# Patient Record
Sex: Male | Born: 1945 | Race: Black or African American | Hispanic: No | Smoking: Never smoker
Health system: Southern US, Community
[De-identification: ages and names within clinical notes are randomized; demographics above are authoritative.]

## PROBLEM LIST (undated history)

## (undated) DIAGNOSIS — G40209 Localization-related (focal) (partial) symptomatic epilepsy and epileptic syndromes with complex partial seizures, not intractable, without status epilepticus: Secondary | ICD-10-CM

## (undated) DIAGNOSIS — I10 Essential (primary) hypertension: Secondary | ICD-10-CM

## (undated) DIAGNOSIS — I639 Cerebral infarction, unspecified: Secondary | ICD-10-CM

## (undated) HISTORY — DX: Localization-related (focal) (partial) symptomatic epilepsy and epileptic syndromes with complex partial seizures, not intractable, without status epilepticus: G40.209

## (undated) HISTORY — DX: Essential (primary) hypertension: I10

## (undated) HISTORY — PX: APPENDECTOMY: SHX54

---

## 2000-08-05 ENCOUNTER — Encounter: Payer: Self-pay | Admitting: Emergency Medicine

## 2000-08-05 ENCOUNTER — Inpatient Hospital Stay (HOSPITAL_COMMUNITY): Admission: EM | Admit: 2000-08-05 | Discharge: 2000-08-07 | Payer: Self-pay | Admitting: Emergency Medicine

## 2000-08-06 ENCOUNTER — Encounter: Payer: Self-pay | Admitting: Pediatrics

## 2001-06-20 ENCOUNTER — Ambulatory Visit (HOSPITAL_COMMUNITY): Admission: RE | Admit: 2001-06-20 | Discharge: 2001-06-20 | Payer: Self-pay | Admitting: *Deleted

## 2001-06-20 ENCOUNTER — Encounter (INDEPENDENT_AMBULATORY_CARE_PROVIDER_SITE_OTHER): Payer: Self-pay | Admitting: Specialist

## 2004-10-08 ENCOUNTER — Encounter (INDEPENDENT_AMBULATORY_CARE_PROVIDER_SITE_OTHER): Payer: Self-pay | Admitting: Specialist

## 2004-10-08 ENCOUNTER — Ambulatory Visit (HOSPITAL_COMMUNITY): Admission: RE | Admit: 2004-10-08 | Discharge: 2004-10-08 | Payer: Self-pay | Admitting: *Deleted

## 2007-08-12 ENCOUNTER — Emergency Department (HOSPITAL_COMMUNITY): Admission: EM | Admit: 2007-08-12 | Discharge: 2007-08-12 | Payer: Self-pay | Admitting: Emergency Medicine

## 2010-10-15 NOTE — Op Note (Signed)
NAME:  [name], [name]                 ACCOUNT NO.:  [id number]   MEDICAL RECORD NO.:  [id number]          PATIENT TYPE:  AMB   LOCATION:  ENDO                         FACILITY:  [facility]   PHYSICIAN:  Georgiana Spinner, M.D.    DATE OF BIRTH:  1945/07/17   DATE OF PROCEDURE:  10/08/2004  DATE OF DISCHARGE:                                 OPERATIVE REPORT   PROCEDURE:  Colonoscopy with biopsy.   INDICATIONS:  Colon polyp.   ANESTHESIA:  Demerol 70, Versed 7 mg.   PROCEDURE:  With the patient mildly sedated in the left lateral decubitus  position, a rectal examination was performed.  Prostate felt enlarged, but I  could not feel the whole thing.  What I did feel was smooth, symmetrical.  Subsequently, the Olympus videoscopic colonoscope was then inserted in the  rectum, passed under direct vision to the cecum identified by ileocecal  valve and base of the cecum.  In the space of the cecum was a polyp that was  photographed and removed using hot biopsy forceps technique setting of 20/20  blended current.  From this point, the colonoscope was then slowly  withdrawn, taking circumferential views of the colonic mucosa, suctioning  fecal debris as we went until we reached the rectum which appeared normal on  direct and retroflexed view.  The endoscope was then straightened and  withdrawn through the anal canal which showed external hemorrhoids.  The  endoscope was withdrawn.  The patient's vital signs, pulse oximeter remained  stable.  The patient tolerated procedure well without apparent  complications.   FINDINGS:  Polyp of cecum.  Await biopsy report.  The patient will call me  for results and follow-up with me as an outpatient      GMO/MEDQ  D:  10/08/2004  T:  10/08/2004  Job:  [id number]

## 2010-10-15 NOTE — Procedures (Signed)
[facility]  Patient:    JEF, FUTCH ROMEL Visit Number: [id number] MRN: [id number]          Service Type: END Location: ENDO Attending Physician:  Sabino Gasser Dictated by:   Sabino Gasser, M.D. Proc. Date: 06/20/01 Admit Date:  06/20/2001                             Procedure Report  PROCEDURE:  Colonoscopy.  INDICATION FOR PROCEDURE:  Colon polyps.  ANESTHESIA:  Demerol 90, Versed 8 mg.  DESCRIPTION OF PROCEDURE:  With the patient mildly sedated in the left lateral decubitus position, the Olympus videoscopic colonoscope was inserted in the rectum and passed under direct vision to the cecum after a normal rectal exam. The cecum was identified by the ileocecal valve and appendiceal orifice both of which were photographed. From this point, the colonoscope was slowly withdrawn taking circumferential views of the entire colonic mucosa, stopping in the rectosigmoid where two polyps were seen, photographed and each was removed using snare cautery technique on a setting of 20:20 blended current. However, the second bled somewhat with removal and the polyp base was then grasped with a hot biopsy forceps and cauterized again using a setting of 20:20 blended current. No active bleeding was seen. The endoscope was withdrawn to the rectum which appeared normal in direct and retroflexed view. The endoscope was straightened and withdrawn. The patients vital signs and pulse oximeter remained stable. The patient tolerated the procedure well without apparent complications.  FINDINGS:  Polyps of rectosigmoid. Await biopsy report. There was some diverticula seen in the sigmoid colon as well. The patient will call me for results of biopsy and followup with me as an outpatient. Avoid aspirin therapy for two weeks due to some of the bleeding seen post polypectomy. Dictated by:   Sabino Gasser, M.D. Attending Physician:  Sabino Gasser DD:  06/20/01 TD:  06/21/01 Job:  [id number]

## 2010-10-15 NOTE — H&P (Signed)
[facility]  Patient:    Ronald [name], Ronald [name]                          MRN: [id number] Adm. Date:  [id number] Disc. Date: [id number] Attending:  Mick Sell CC:         Lorelle Formosa, M.D.   History and Physical  DATE OF BIRTH:  1945-09-30.  CHIEF COMPLAINT:  Problems with memory -- acute.  HISTORY OF THE PRESENT CONDITION:  Patient had onset of memory dysfunction around 2 p.m.  His girlfriend asked him a simple question and he seemed to be unable to answer it.  She became at first suspicious that he was clowning around and then asked him a number of specific questions that demonstrated to her clearly that he did not have memory for recent events or for things that he should know.  His symptoms continued and he was brought to the emergency room around 1755 hours.  I was called at 1840 and agreed to see him when Dr. Smitty Cords. Cheek found that he was having significant problems with his memory, both acute and remote, but otherwise had a nonfocal examination.  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  He has not had intercurrent infections in the head and neck, lungs, GI or GU; rash; easy bruisability; diabetes or thyroid disease; nausea; vomiting; diarrhea; dyspnea; chest pain; palpitations; fever; change in weight; problems with sleep or night sweats.  Review of systems is otherwise negative.  MEDICATIONS:  None.  ALLERGIES:  None.  PAST SURGICAL HISTORY:  Appendectomy.  SOCIAL HISTORY:  Patient does not smoke or use alcohol.  He has been with his girlfriend for two years now (she is at bedside).  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  The pain has gradually improved.  Workup with Dr. Lorelle Formosa was negative for blood work but he has not had any x-rays.  He has a 12th grade education.  FAMILY HISTORY:   Father died of diabetes.  Mother died of cancer.  Sister died of cancer.  No history of hypertension, cerebrovascular disease or myocardial infarction.  PHYSICAL EXAMINATION:  GENERAL:  On examination today, this is a pleasant gentleman, 65 years of age, right-handed, in no distress.  VITAL SIGNS:  Blood pressure 149/86, resting pulse 94, respirations 20, temperature 98.1.  HEENT/NECK:  No signs of infection.  No cranial or cervical bruits.  No localized tenderness in the head and neck region.  LUNGS:  Clear to auscultation.  HEART:  No murmurs.  Pulses normal.  ABDOMEN:  Soft, nontender.  Bowel sounds normal.  EXTREMITIES:  Well-formed without edema, cyanosis, alterations in tone or tight heel cords.  He does have tenderness in his knees; they do not seem to be enlarged or inflamed.  NEUROLOGIC:  Mental status:  Patient was awake, alert.  He knows his location (at [facility]), month, day and year but not the date.  He could recall 3-of-3 objects at 30 seconds but only 1 of 3 at 5 minutes. Digit span was six digits forward and three backwards.  He was able to name objects, follow commands and repeat phrases.  He could not do simple calculations or even spell the word "world" forwards; this is despite a 12th grade education.  Cranial nerves:  Round,  reactive pupils.  Normal fundi.  Visual fields full to double simultaneous stimuli.  Extraocular movements full and conjugate, okay and responses equal bilaterally.  Symmetric facial strength and sensation. Air conduction greater than bone conduction.  He was able to protrude his tongue and elevate his uvula in the midline.  Motor examination:  Normal stance, tone and mass.  Good fine motor movements. No pronator drift.  Sensation intact to cold, vibration and stereoagnosis.  He does sense a mild hypesthesia on his left face, that is, the right face feels cooler both in the head and neck region.  His sensation is equal  on the torso.  Cerebellum:  No tremor, dystaxia or dysmetria.  Gait and station were mildly broad-based.  He can walk on his heels and toes.  He cannot tandem.  He has a negative Romberg response.  Deep tendon reflexes are normal except at the ankles where they were absent and patient has bilateral flexor plantar responses.  IMPRESSION: 1. 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  PLAN:  I have reviewed the CT scan personally.  It is normal other than some mild changes in the ethmoid air cells, consistent with congested and/or mild sinusitis.  Patients laboratory studies:  Urinalysis -- pH 7, specific gravity 1.026. Chemistries were negative.  Urine toxicology screen was negative. Comprehensive metabolic:  Sodium 139, potassium 4.0, chloride 105, CO2 26, BUN 15, creatinine 0.9, glucose 119, calcium 9.6, total protein 7.5, albumin 4.0, SGOT 30, SGPT 44, alkaline phosphatase 72, total bilirubin 0.7.  PT 13, PTT 30.  CBC:  WBC count 8500, hemoglobin 15.2, hematocrit 43.9, MCV 85.6, platelet count 264,000, 76 polys, 15 lymphs, 7 monocytes, 1 eosinophil.  EKG:  Sinus tachycardia.  Patients blood pressure which was described initially has varied, with the systolic dropping down to 135, diastolic sitting around 85, resting pulse 90, pulse oximetry 99%.  We will admit the patient to the hospital, observe him, will check an MRI/MRA, perform an EEG, which could be done as an outpatient.  Patient is most unhappy at being admitted to the hospital but understands that this is necessary because of the differential diagnosis which could include stroke but in this case is unlikely to be related to stroke.  While in the hospital, we will perform simple x-ray films of his knees and I may take this further, depending on what we see. DD:  08/05/00 TD:  08/07/00 Job: [id number]

## 2010-10-15 NOTE — Discharge Summary (Signed)
[facility]  Patient:    Ronald [name], Ronald [name]                          MRN: [id number] Adm. Date:  [id number] Disc. Date: 08/07/00 Attending:  Mick Sell CC:         Lorelle Formosa, M.D.   Discharge Summary  FINAL DIAGNOSES: 1. 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  PROCEDURES PERFORMED: 1. MRI of brain. 2. MRA intracranial. 3. EEG.  COMPLICATIONS:  None.  HOSPITAL COURSE:  The patient was admitted because of a sudden onset of problems with acute and remote memory. This began to clear while he was in the emergency room, however, it had not fully cleared by the next morning of his hospitalization. 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 There was mild maxillary sinus disease.  MR angiography was normal other than the anterior inferior cerebellar arteries were not visualized bilaterally. Electroencephalogram has not been performed at the time of this dictation and will be done today.  His laboratory studies are as follows:  The patient has had a regular sinus rhythm in the hospitalization. His EKG showed sinus tachycardia which had settled down to regular rate and rhythm. X-rays of his knees showed unremarkable right and left knees without evidence of fracture, subluxation, dislocation, joint effusion or significant joint degenerative changes.  Laboratory studies have been noted in the history and physical examination. CBC was normal. Glucose was slightly elevated at 119, but this was a nonfasting study; ALT slightly elevated at 44. Urine drug screen was negative. Urinalysis also was negative.  This morning, the patient is awake, he has just awakened and was somewhat sleepy. None the less, he was fully oriented. Vital signs revealed a blood pressure of 126/70,  resting pulse 72, respirations 16 and temperature 97.2. Lungs clear. Heart -- no murmurs, pulse is normal. Abdomen is soft, bowel sounds are normal. Extremities were normal. On neurologic examination, the patient is thinking slowly but fully awake. He has no dysphagia or dyspraxia. He is oriented to person, place and date. His memory is better than it was on the night of admission. Cranial nerves revealed round reactive pupils, normal fundi, visual fields full to double simultaneous stimuli. Extraocular movements full and conjugate. Okay in response to confrontation bilaterally. Symmetrical facial strength and sensation. Air conduction greater than bone conduction. He has a soft high-pitched voice that is unchanged from his admission. Motor examination reveals normal strength, tone and mass. Good fine motor movements, no pronator drift. Sensation intact to primary and cortical modalities. Cerebellar examination reveals no tremor, dystaxia or dysmetria. Gait and station was normal. He is not dragging his foot. He can get up on his toes and heels.  Deep tendon reflexes were normal except at the ankles which were diminished.  DISPOSITION: The patient is safe to discharge home. We will perform an EEG before he goes home, either at [facility] or [facility].  DISCHARGE INSTRUCTIONS: 1. He should start an aspirin 325 mg per day. 2. He should eat a low-sat, low-fat diet. 3. He can return to work when his leave is up (next week). 4. He should be followed up this week in our office so that we can tie together loose ends from the hospitalization and plan for any further evaluation. DD:  08/07/00 TD:  08/07/00 Job: [id number]

## 2011-02-21 LAB — URINE MICROSCOPIC-ADD ON

## 2011-02-21 LAB — COMPREHENSIVE METABOLIC PANEL
ALT: 22
AST: 21
Albumin: 3.6
Alkaline Phosphatase: 73
Chloride: 104
Potassium: 3.7
Sodium: 136
Total Protein: 6.8

## 2011-02-21 LAB — URINALYSIS, ROUTINE W REFLEX MICROSCOPIC
Hgb urine dipstick: NEGATIVE
Nitrite: NEGATIVE
Specific Gravity, Urine: 1.023
Urobilinogen, UA: 1
pH: 8

## 2011-02-21 LAB — DIFFERENTIAL
Basophils Relative: 0
Eosinophils Absolute: 0
Eosinophils Relative: 0
Monocytes Absolute: 0.2
Monocytes Relative: 2 — ABNORMAL LOW

## 2011-02-21 LAB — CBC
Hemoglobin: 14.5
Platelets: 244
RDW: 13.7
WBC: 9.7

## 2013-09-28 ENCOUNTER — Emergency Department (HOSPITAL_COMMUNITY): Payer: Medicare Other

## 2013-09-28 ENCOUNTER — Inpatient Hospital Stay (HOSPITAL_COMMUNITY)
Admission: EM | Admit: 2013-09-28 | Discharge: 2013-09-30 | DRG: 101 | Disposition: A | Discharge status: Home / Self Care | Payer: Medicare Other | Attending: Internal Medicine | Admitting: Internal Medicine

## 2013-09-28 ENCOUNTER — Encounter (HOSPITAL_COMMUNITY): Payer: Self-pay | Admitting: Emergency Medicine

## 2013-09-28 DIAGNOSIS — R569 Unspecified convulsions: Secondary | ICD-10-CM

## 2013-09-28 DIAGNOSIS — E876 Hypokalemia: Secondary | ICD-10-CM

## 2013-09-28 DIAGNOSIS — R030 Elevated blood-pressure reading, without diagnosis of hypertension: Secondary | ICD-10-CM

## 2013-09-28 DIAGNOSIS — R81 Glycosuria: Secondary | ICD-10-CM | POA: Diagnosis present

## 2013-09-28 DIAGNOSIS — G40401 Other generalized epilepsy and epileptic syndromes, not intractable, with status epilepticus: Secondary | ICD-10-CM

## 2013-09-28 DIAGNOSIS — G40109 Localization-related (focal) (partial) symptomatic epilepsy and epileptic syndromes with simple partial seizures, not intractable, without status epilepticus: Principal | ICD-10-CM | POA: Diagnosis present

## 2013-09-28 DIAGNOSIS — G40201 Localization-related (focal) (partial) symptomatic epilepsy and epileptic syndromes with complex partial seizures, not intractable, with status epilepticus: Secondary | ICD-10-CM | POA: Diagnosis present

## 2013-09-28 DIAGNOSIS — IMO0001 Reserved for inherently not codable concepts without codable children: Secondary | ICD-10-CM | POA: Diagnosis present

## 2013-09-28 DIAGNOSIS — I10 Essential (primary) hypertension: Secondary | ICD-10-CM | POA: Diagnosis present

## 2013-09-28 DIAGNOSIS — Z8673 Personal history of transient ischemic attack (TIA), and cerebral infarction without residual deficits: Secondary | ICD-10-CM

## 2013-09-28 HISTORY — DX: Cerebral infarction, unspecified: I63.9

## 2013-09-28 LAB — CBC WITH DIFFERENTIAL/PLATELET
BASOS ABS: 0 10*3/uL (ref 0.0–0.1)
BASOS PCT: 0 % (ref 0–1)
Eosinophils Absolute: 0 10*3/uL (ref 0.0–0.7)
Eosinophils Relative: 0 % (ref 0–5)
HEMATOCRIT: 39.8 % (ref 39.0–52.0)
HEMOGLOBIN: 13.7 g/dL (ref 13.0–17.0)
LYMPHS PCT: 13 % (ref 12–46)
Lymphs Abs: 1.1 10*3/uL (ref 0.7–4.0)
MCH: 29.3 pg (ref 26.0–34.0)
MCHC: 34.4 g/dL (ref 30.0–36.0)
MCV: 85 fL (ref 78.0–100.0)
MONO ABS: 0.4 10*3/uL (ref 0.1–1.0)
MONOS PCT: 5 % (ref 3–12)
NEUTROS ABS: 6.9 10*3/uL (ref 1.7–7.7)
NEUTROS PCT: 82 % — AB (ref 43–77)
Platelets: 193 10*3/uL (ref 150–400)
RBC: 4.68 MIL/uL (ref 4.22–5.81)
RDW: 13.2 % (ref 11.5–15.5)
WBC: 8.5 10*3/uL (ref 4.0–10.5)

## 2013-09-28 LAB — URINALYSIS, ROUTINE W REFLEX MICROSCOPIC
Bilirubin Urine: NEGATIVE
GLUCOSE, UA: 100 mg/dL — AB
HGB URINE DIPSTICK: NEGATIVE
KETONES UR: NEGATIVE mg/dL
Leukocytes, UA: NEGATIVE
Nitrite: NEGATIVE
PROTEIN: NEGATIVE mg/dL
Specific Gravity, Urine: 1.02 (ref 1.005–1.030)
UROBILINOGEN UA: 2 mg/dL — AB (ref 0.0–1.0)
pH: 7 (ref 5.0–8.0)

## 2013-09-28 LAB — COMPREHENSIVE METABOLIC PANEL
ALBUMIN: 3.4 g/dL — AB (ref 3.5–5.2)
ALK PHOS: 88 U/L (ref 39–117)
ALT: 13 U/L (ref 0–53)
AST: 20 U/L (ref 0–37)
BILIRUBIN TOTAL: 0.7 mg/dL (ref 0.3–1.2)
BUN: 17 mg/dL (ref 6–23)
CHLORIDE: 105 meq/L (ref 96–112)
CO2: 25 meq/L (ref 19–32)
CREATININE: 0.85 mg/dL (ref 0.50–1.35)
Calcium: 8.8 mg/dL (ref 8.4–10.5)
GFR calc Af Amer: >90 mL/min (ref >90)
GFR, EST NON AFRICAN AMERICAN: 88 mL/min — AB (ref >90)
Glucose, Bld: 160 mg/dL — ABNORMAL HIGH (ref 70–99)
POTASSIUM: 3.4 meq/L — AB (ref 3.7–5.3)
Sodium: 141 mEq/L (ref 137–147)
Total Protein: 7.1 g/dL (ref 6.0–8.3)

## 2013-09-28 LAB — CBG MONITORING, ED: GLUCOSE-CAPILLARY: 181 mg/dL — AB (ref 70–99)

## 2013-09-28 LAB — RAPID URINE DRUG SCREEN, HOSP PERFORMED
AMPHETAMINES: NOT DETECTED
BARBITURATES: NOT DETECTED
BENZODIAZEPINES: NOT DETECTED
Cocaine: NOT DETECTED
Opiates: NOT DETECTED
TETRAHYDROCANNABINOL: NOT DETECTED

## 2013-09-28 LAB — ETHANOL: Alcohol, Ethyl (B): <11 mg/dL (ref 0–11)

## 2013-09-28 MED ORDER — HEPARIN SODIUM (PORCINE) 5000 UNIT/ML IJ SOLN
5000.0000 [IU] | Freq: Three times a day (TID) | INTRAMUSCULAR | Status: DC
  Administered 2013-09-29 – 2013-09-30 (×4): 5000 [IU] via SUBCUTANEOUS
  Filled 2013-09-28 (×7): qty 1

## 2013-09-28 MED ORDER — LORAZEPAM 2 MG/ML IJ SOLN
0.5000 mg | Freq: Once | INTRAMUSCULAR | Status: AC
  Administered 2013-09-28: 0.5 mg via INTRAVENOUS
  Filled 2013-09-28: qty 1

## 2013-09-28 MED ORDER — SODIUM CHLORIDE 0.9 % IV BOLUS (SEPSIS)
1000.0000 mL | Freq: Once | INTRAVENOUS | Status: AC
  Administered 2013-09-28: 1000 mL via INTRAVENOUS

## 2013-09-28 MED ORDER — LEVETIRACETAM 500 MG/5ML IV SOLN
1000.0000 mg | Freq: Once | INTRAVENOUS | Status: AC
  Administered 2013-09-28: 1000 mg via INTRAVENOUS
  Filled 2013-09-28: qty 10

## 2013-09-28 MED ORDER — LEVETIRACETAM 500 MG PO TABS
500.0000 mg | ORAL_TABLET | Freq: Two times a day (BID) | ORAL | Status: DC
  Administered 2013-09-29 – 2013-09-30 (×3): 500 mg via ORAL
  Filled 2013-09-28 (×4): qty 1

## 2013-09-28 MED ORDER — SODIUM CHLORIDE 0.9 % IJ SOLN
3.0000 mL | Freq: Two times a day (BID) | INTRAMUSCULAR | Status: DC
  Administered 2013-09-29 (×2): 3 mL via INTRAVENOUS

## 2013-09-28 NOTE — ED Notes (Signed)
Patient transported to CT 

## 2013-09-28 NOTE — Progress Notes (Signed)
Attempted report [id number] with two RNs, gave number to call back.

## 2013-09-28 NOTE — ED Notes (Signed)
CBGTaken = 188

## 2013-09-28 NOTE — ED Notes (Signed)
Pt returned from radiology.

## 2013-09-28 NOTE — Consult Note (Signed)
Neurology Consultation Reason for Consult: Episodes of altered mental status Referring Physician: Maryan Rued, W.  CC: Episodes of altered mental status  History is obtained from: Patient, friend  HPI: Ronald [name] is a 68 y.o. male with a history of stroke who presents with recurrent episodes of altered mental status over the course of an hour. His friend describes that he felt nauseous, then become unresponsive for brief periods of time. He had right head turning with right eye deviation as well as right arm automatisms. He would then suddenly come back to being able to interact. These are all fairly brief, but he had recurrent episodes repeatedly over the course of an hour without ever come back completely to his normal self.   ROS: A 14 point ROS was performed and is negative except as noted in the HPI.  Past Medical History  Diagnosis Date  . Stroke     Family History: No history of seizures  Social History: Tob: Denies  Exam: Current vital signs: BP 145/91  Pulse 104  Temp(Src) 98.5 F (36.9 C) (Oral)  Resp 21  SpO2 98% Vital signs in last 24 hours: Temp:  [98.5 F (36.9 C)] 98.5 F (36.9 C) (05/02 2035) Pulse Rate:  [102-104] 104 (05/02 2139) Resp:  [20-21] 21 (05/02 2139) BP: (145-184)/(81-94) 145/91 mmHg (05/02 2139) SpO2:  [98 %] 98 % (05/02 2139)  General: In bed, NAD CV: Regular rate and rhythm Mental Status: Patient is awake, alert, oriented to person, place, month, year, and situation. Immediate and remote memory are intact. Patient is able to give a clear and coherent history. No signs of aphasia or neglect Cranial Nerves: II: Visual Fields are full. Pupils are equal, round, and reactive to light.  Discs are difficult to visualize. III,IV, VI: EOMI without ptosis or diploplia.  V: Facial sensation is symmetric to temperature VII: Facial movement is symmetric.  VIII: hearing is intact to voice X: Uvula elevates symmetrically XI: Shoulder shrug is  symmetric. XII: tongue is midline without atrophy or fasciculations.  Motor: Tone is normal. Bulk is normal. 5/5 strength was present in all four extremities.  Sensory: Sensation is symmetric to light touch and temperature in the arms and legs. Deep Tendon Reflexes: 2+ and symmetric in the biceps and patellae.  Plantars: Toes are downgoing bilaterally.  Cerebellar: FNF and HKS are intact bilaterally Gait: Not tested secondary multiple medical monitors in ED setting.         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 He seems to improve markedly following 0.5 mg of Ativan. Given the recurrent nature and the number of seizures, I would favor admission for observation.  Recommendations: 1) MRI brain without contrast 2) EEG 3) Keppra 500 mg twice a day following 1 g load   Roland Rack, MD [facility] [phone]  If 7pm- 7am, please [facility] neurology on call as listed in [facility].

## 2013-09-28 NOTE — ED Provider Notes (Addendum)
CSN: [id number]     Arrival date & time 09/28/13  1937 History   First MD Initiated Contact with Patient 09/28/13 1937     Chief Complaint  Patient presents with  . Altered Mental Status     (Consider location/radiation/quality/duration/timing/severity/associated sxs/prior Treatment) Patient is a 68 y.o. male presenting with altered mental status. The history is provided by the patient and the EMS personnel.  Altered Mental Status Presenting symptoms: partial responsiveness   Severity:  Moderate Most recent episode:  Today Episode history:  Multiple Duration:  10 seconds Timing:  Intermittent Progression:  Unchanged Chronicity:  New Context: not alcohol use, not drug use, not head injury, taking medications as prescribed, not a nursing home resident, not a recent change in medication, not a recent illness and not a recent infection   Associated symptoms: no difficulty breathing, no palpitations, no slurred speech, no vomiting and no weakness   Associated symptoms comment:  Mild RLQ pain and states he feels a little dizzy   Past Medical History  Diagnosis Date  . Stroke    Past Surgical History  Procedure Laterality Date  . Appendectomy     History reviewed. No pertinent family history. History  Substance Use Topics  . Smoking status: Never Smoker   . Smokeless tobacco: Not on file  . Alcohol Use: No    Review of Systems  Cardiovascular: Negative for palpitations.  Gastrointestinal: Negative for vomiting.  Neurological: Negative for weakness.  All other systems reviewed and are negative.     Allergies  Review of patient's allergies indicates no known allergies.  Home Medications   Prior to Admission medications   Not on File   BP 184/94  Pulse 102  Temp(Src) 98.5 F (36.9 C) (Oral)  Resp 21  SpO2 98% Physical Exam  Nursing note and vitals reviewed. Constitutional: He is oriented to person, place, and time. He appears well-developed and well-nourished.  No distress.  HENT:  Head: Normocephalic and atraumatic.  Mouth/Throat: Oropharynx is clear and moist.  Eyes: Conjunctivae and EOM are normal. Pupils are equal, round, and reactive to light.  Neck: Normal range of motion. Neck supple.  Cardiovascular: Regular rhythm and intact distal pulses.  Tachycardia present.   No murmur heard. Pulmonary/Chest: Effort normal and breath sounds normal. No respiratory distress. He has no wheezes. He has no rales.  Abdominal: Soft. He exhibits no distension. There is no tenderness. There is no rebound and no guarding.  Musculoskeletal: Normal range of motion. He exhibits no edema and no tenderness.  Neurological: He is alert and oriented to person, place, and time.  When pt has an episode his head falls back on the bed, eyes are fixed and he does not respond then jerks and seems slightly confused where he is and then back to baseline  Skin: Skin is warm and dry. No rash noted. No erythema.  Psychiatric: He has a normal mood and affect. His behavior is normal.    ED Course  Procedures (including critical care time) Labs Review Labs Reviewed  CBC WITH DIFFERENTIAL - Abnormal; Notable for the following:    Neutrophils Relative % 82 (*)    All other components within normal limits  COMPREHENSIVE METABOLIC PANEL - Abnormal; Notable for the following:    Potassium 3.4 (*)    Glucose, Bld 160 (*)    Albumin 3.4 (*)    GFR calc non Af Amer 88 (*)    All other components within normal limits  URINALYSIS, ROUTINE W REFLEX  MICROSCOPIC - Abnormal; Notable for the following:    Glucose, UA 100 (*)    Urobilinogen, UA 2.0 (*)    All other components within normal limits  CBG MONITORING, ED - Abnormal; Notable for the following:    Glucose-Capillary 181 (*)    All other components within normal limits  ETHANOL  URINE RAPID DRUG SCREEN (HOSP PERFORMED)    Imaging Review Dg Chest 2 View  09/28/2013   CLINICAL DATA:  Altered mental status.  EXAM: CHEST  2  VIEW  COMPARISON:  Report dated 08/05/2000.  FINDINGS: Normal sized heart. Clear lungs with normal vascularity. Thoracolumbar spine degenerative changes.  IMPRESSION: No acute abnormality.   Electronically Signed   By: Enrique Sack M.D.   On: 09/28/2013 21:16   Ct Head Wo Contrast  09/28/2013   CLINICAL DATA:  Altered mental status  EXAM: CT HEAD WITHOUT CONTRAST  TECHNIQUE: Contiguous axial images were obtained from the base of the skull through the vertex without intravenous contrast.  COMPARISON:  None.  FINDINGS: No evidence of parenchymal hemorrhage or extra-axial fluid collection. No mass lesion, mass effect, or midline shift.  No CT evidence of acute infarction.  Subcortical white matter and periventricular small vessel ischemic changes.  Cerebral volume is within normal limits.  No ventriculomegaly.  Partial opacification of the bilateral maxillary sinusitis. Mastoid air cells are clear.  No evidence of calvarial fracture.  IMPRESSION: No evidence of acute intracranial abnormality.  Small vessel ischemic changes.   Electronically Signed   By: Julian Hy M.D.   On: 09/28/2013 21:02     Date: 09/28/2013  Rate: 105  Rhythm: sinus tachycardia  QRS Axis: normal  Intervals: normal  ST/T Wave abnormalities: nonspecific ST changes  Conduction Disutrbances:none  Narrative Interpretation:   Old EKG Reviewed: unchanged   MDM   Final diagnoses:  Seizure    Patient presenting by EMS with brief episodes of unresponsiveness that is now been ongoing for the last one hour. They occur every 2-3 minutes and lasts for less than 30 seconds. Patient has no prior history of similar in his only complaint is feeling dizzy. He denies any medications, drug or alcohol use. He does have a history of stroke in 2006 but denies taking any medication for this.  Patient denies any history of seizure in the past. On exam patient has normal mental status neurologic exam however every 2-3 minutes he has a 5-10  second episode where he becomes unresponsive his eyes are fixed and then he becomes startled seems slightly confused and then is back to normal mental function alert oriented x3.  Concern that this could be partial complex seizures versus other neurologic cause for her symptoms. Does not appear to be syncope or dysrhythmia as patient is on monitoring heart rate does not change. Patient is hypertensive here without history of the same. Low suspicion for drug or alcohol use as patient denies any does not appear intoxicated.  He denies any chest pain or shortness of breath.  CBC, CMP, UA, EtOH, UDS, CBG, chest x-ray and head CT pending for further evaluation. Patient given 0.5 mg of IV Ativan to see if symptoms resolve  9:53 PM All labs, head CT and chest x-ray without acute findings. After getting 0.5 of Ativan patient's symptoms completely resolved. Discussed patient with neurology and they will come and evaluate him for possible new onset seizures.  10:28 PM Patient seen by neurology and felt this was an episode of status epilepticus which is now  resolved and the last one hour. They recommend admission for MRI and EEG. Also probably need evaluation for new-onset diabetes and hypertension.  Blanchie Dessert, MD 09/28/13 2153  Blanchie Dessert, MD 09/28/13 [id number]  Blanchie Dessert, MD 09/28/13 2245

## 2013-09-28 NOTE — H&P (Addendum)
[facility] History and Physical  Ronald [name] XAJ:[id number] DOB: 1946/03/25 DOA: 09/28/2013  Referring physician: EDP PCP: No primary provider on file.   Chief Complaint: AMS   HPI: Ronald [name] is a 68 y.o. male who presents to the ED with AMS.  Per family he will have 10 seconds or less of unresponsiveness, followed by confusion, followed by return to normal mental status.  This started earlier today and has been persistent throughout the day occuring every 2-3 mins.  Denies a history of seizures in the past.  He had several of these episodes in the ED witnessed by the EDP, and EDP decided to give 0.5mg  of ativan to see if this had any effect.  Symptoms have completely resolved since then without returning.  Review of Systems: Systems reviewed.  As above, otherwise negative  Past Medical History  Diagnosis Date  . Stroke    Past Surgical History  Procedure Laterality Date  . Appendectomy     Social History:  reports that he has never smoked. He does not have any smokeless tobacco history on file. He reports that he does not drink alcohol or use illicit drugs.  No Known Allergies  History reviewed. No pertinent family history.   Prior to Admission medications   Medication Sig Start Date End Date Taking? Authorizing Provider  naproxen sodium (ANAPROX) 220 MG tablet Take 220 mg by mouth daily as needed (headache).   Yes Historical Provider, MD   Physical Exam: Filed Vitals:   09/28/13 2139  BP: 145/91  Pulse: 104  Temp:   Resp: 21    BP 145/91  Pulse 104  Temp(Src) 98.5 F (36.9 C) (Oral)  Resp 21  SpO2 98%  General Appearance:    Alert, oriented, no distress, appears stated age  Head:    Normocephalic, atraumatic  Eyes:    PERRL, EOMI, sclera non-icteric        Nose:   Nares without drainage or epistaxis. Mucosa, turbinates normal  Throat:   Moist mucous membranes. Oropharynx without erythema or exudate.  Neck:   Supple. No carotid bruits.  No  thyromegaly.  No lymphadenopathy.   Back:     No CVA tenderness, no spinal tenderness  Lungs:     Clear to auscultation bilaterally, without wheezes, rhonchi or rales  Chest wall:    No tenderness to palpitation  Heart:    Regular rate and rhythm without murmurs, gallops, rubs  Abdomen:     Soft, non-tender, nondistended, normal bowel sounds, no organomegaly  Genitalia:    deferred  Rectal:    deferred  Extremities:   No clubbing, cyanosis or edema.  Pulses:   2+ and symmetric all extremities  Skin:   Skin color, texture, turgor normal, no rashes or lesions  Lymph nodes:   Cervical, supraclavicular, and axillary nodes normal  Neurologic:   CNII-XII intact. Normal strength, sensation and reflexes      throughout    Labs on Admission:  Basic Metabolic Panel:  Recent Labs Lab 09/28/13 2036  NA 141  K 3.4*  CL 105  CO2 25  GLUCOSE 160*  BUN 17  CREATININE 0.85  CALCIUM 8.8   Liver Function Tests:  Recent Labs Lab 09/28/13 2036  AST 20  ALT 13  ALKPHOS 88  BILITOT 0.7  PROT 7.1  ALBUMIN 3.4*   No results found for this basename: LIPASE, AMYLASE,  in the last 168 hours No results found for this basename: AMMONIA,  in the last  168 hours CBC:  Recent Labs Lab 09/28/13 2036  WBC 8.5  NEUTROABS 6.9  HGB 13.7  HCT 39.8  MCV 85.0  PLT 193   Cardiac Enzymes: No results found for this basename: CKTOTAL, CKMB, CKMBINDEX, TROPONINI,  in the last 168 hours  BNP (last 3 results) No results found for this basename: PROBNP,  in the last 8760 hours CBG:  Recent Labs Lab 09/28/13 2012  GLUCAP 181*    Radiological Exams on Admission: Dg Chest 2 View  09/28/2013   CLINICAL DATA:  Altered mental status.  EXAM: CHEST  2 VIEW  COMPARISON:  Report dated 08/05/2000.  FINDINGS: Normal sized heart. Clear lungs with normal vascularity. Thoracolumbar spine degenerative changes.  IMPRESSION: No acute abnormality.   Electronically Signed   By: Enrique Sack M.D.   On: 09/28/2013  21:16   Ct Head Wo Contrast  09/28/2013   CLINICAL DATA:  Altered mental status  EXAM: CT HEAD WITHOUT CONTRAST  TECHNIQUE: Contiguous axial images were obtained from the base of the skull through the vertex without intravenous contrast.  COMPARISON:  None.  FINDINGS: No evidence of parenchymal hemorrhage or extra-axial fluid collection. No mass lesion, mass effect, or midline shift.  No CT evidence of acute infarction.  Subcortical white matter and periventricular small vessel ischemic changes.  Cerebral volume is within normal limits.  No ventriculomegaly.  Partial opacification of the bilateral maxillary sinusitis. Mastoid air cells are clear.  No evidence of calvarial fracture.  IMPRESSION: No evidence of acute intracranial abnormality.  Small vessel ischemic changes.   Electronically Signed   By: Julian Hy M.D.   On: 09/28/2013 21:02    EKG: Independently reviewed.  Assessment/Plan Active Problems:   Complex partial status epilepticus   Elevated blood pressure   Glucosuria   1. Complex partial status epilepticus - after neurology evaluated him, it is felt his symptoms today represent complex partial status epilepticus.  He has been loaded with keppra and BID keppra has been ordered by neurology.  EEG ordered, MRI ordered.  Admitting to inpatient on tele monitor. 2. Elevated BP - elevated BP in ED with no known history of HTN, but he does not see a doctor regularly, suspect he has undiagnosed HTN, will follow. 3. Glucosuria - again patient does not see doctor at baseline, CBG checks ordered and A1C ordered, BGL only 160 this evening however, so will hold off on treating for now.    Code Status: Full Code  Family Communication: No family in room Disposition Plan: Admit to inpatient   Time spent: 6 min  [facility] Pager [phone]  If 7AM-7PM, please contact the day team taking care of the patient [email or web address] Password [facility] 09/28/2013, 10:49  PM

## 2013-09-28 NOTE — ED Notes (Signed)
On side of road with family. He was sitting back. He became confused, hot, nauseated. Hx. Of stroke 2006. Vs: 190/96, hr 105, sa02 98 ra, bp 168; did c/o of rt. Lower quad pain. Hx. Of appendectomy.

## 2013-09-28 NOTE — ED Notes (Signed)
Pt. Stated, "at one time, told I might have seizures."

## 2013-09-28 NOTE — Progress Notes (Signed)
Received report from [facility] [id number]. Room ready and awaiting arrival of pt. Seizure precautions started.

## 2013-09-29 ENCOUNTER — Inpatient Hospital Stay (HOSPITAL_COMMUNITY): Payer: Medicare Other

## 2013-09-29 DIAGNOSIS — R569 Unspecified convulsions: Secondary | ICD-10-CM

## 2013-09-29 LAB — GLUCOSE, CAPILLARY
GLUCOSE-CAPILLARY: 111 mg/dL — AB (ref 70–99)
GLUCOSE-CAPILLARY: 100 mg/dL — AB (ref 70–99)
Glucose-Capillary: 101 mg/dL — ABNORMAL HIGH (ref 70–99)

## 2013-09-29 MED ORDER — ASPIRIN 81 MG PO CHEW
81.0000 mg | CHEWABLE_TABLET | Freq: Every day | ORAL | Status: DC
Start: 2013-09-29 — End: 2013-09-30
  Administered 2013-09-29 – 2013-09-30 (×2): 81 mg via ORAL
  Filled 2013-09-29 (×2): qty 1

## 2013-09-29 MED ORDER — HYDRALAZINE HCL 20 MG/ML IJ SOLN
10.0000 mg | Freq: Once | INTRAMUSCULAR | Status: AC
  Administered 2013-09-29: 10 mg via INTRAVENOUS
  Filled 2013-09-29: qty 1

## 2013-09-29 MED ORDER — METOPROLOL TARTRATE 12.5 MG HALF TABLET
12.5000 mg | ORAL_TABLET | Freq: Two times a day (BID) | ORAL | Status: DC
  Administered 2013-09-29: 12.5 mg via ORAL
  Filled 2013-09-29 (×2): qty 1

## 2013-09-29 MED ORDER — METOPROLOL TARTRATE 25 MG PO TABS
25.0000 mg | ORAL_TABLET | Freq: Two times a day (BID) | ORAL | Status: DC
  Administered 2013-09-29 – 2013-09-30 (×2): 25 mg via ORAL
  Filled 2013-09-29 (×3): qty 1

## 2013-09-29 NOTE — Progress Notes (Signed)
[facility] PROGRESS NOTE  Ronald [name] IRS:[id number] DOB: 1946/02/07 DOA: 09/28/2013 PCP: No primary provider on file.   Brief narrative 68 y.o. male who presents to the ED with AMS. Per family he had 10 seconds or less of unresponsiveness, followed by confusion, followed by return to normal mental status. This started earlier today and has been persistent throughout the day occuring every 2-3 mins. Denies a history of seizures in the past.  He had several of these episodes in the ED witnessed by the EDP, and EDP decided to give 0.5mg  of ativan to see if this had any effect. Symptoms have completely resolved since.   Assessment/Plan: Complex partial Seizures . He has been loaded with keppra and placed on BID keppraEEG ordered, MRI brain shows chronic microvascular changes.  EEG pending. contine neuro checks. PT eval.  Elevated BP Not on any meds. i have started him on low dose metoprolol.  Hx of CVA  reports hx of stroke about 10 yrs back with residual RLE weakness. Reports that he Walks with some unsteady gait since then. He however does not see a doctor or take any medications.  added baby aspirin. Check lipid panel.  Elevated blood glucose  check A1C. Place on SSI  DVT prophylaxis: sq heparin  Diet: cardiac   Code Status: full code Family Communication: spoke with sister Ronald [name]. She is not aware about him being in the hospital although  she lives nearby . She is not sure who brought pt to the hospital. She told me pt has a daughter Ronald [name]. She does not have her no.  Disposition Plan: home once w/up completed. Possibly in am. Patient needs to establish care with outpt PCP. ( [facility]). He is recommend ed not to drive or operate heavy machinery or swim unless cleared by outpt neurology.   Consultants:  neurology  Procedures:  Pending MRI brain and  EEG  Antibiotics:  none  HPI/Subjective: Admission H&P reviewed. patient denies any symptoms at present  Objective: Filed Vitals:   09/29/13 0853  BP: 157/85  Pulse: 71  Temp: 98.1 F (36.7 C)  Resp: 18    Intake/Output Summary (Last 24 hours) at 09/29/13 1410 Last data filed at 09/29/13 1300  Gross per 24 hour  Intake    280 ml  Output    925 ml  Net   -645 ml   Filed Weights   09/29/13 0030  Weight: 69.718 kg (153 lb 11.2 oz)    Exam:   General:  Elderly male in NAD  HEENT: no pallor, moist oral mucosa  Chest: clear b/l, no added sounds  CVS: N S1&S2, no MRG  Abd: soft, NT, ND BS+  Ext: warm, no edema  CNS: AAOX3, 4+/5 power over RLE  Data Reviewed: Basic Metabolic Panel:  Recent Labs Lab 09/28/13 2036  NA 141  K 3.4*  CL 105  CO2 25  GLUCOSE 160*  BUN 17  CREATININE 0.85  CALCIUM 8.8   Liver Function Tests:  Recent Labs Lab 09/28/13 2036  AST 20  ALT 13  ALKPHOS 88  BILITOT 0.7  PROT 7.1  ALBUMIN 3.4*   No results found for this basename: LIPASE, AMYLASE,  in the last 168 hours No results found for this basename: AMMONIA,  in the last 168 hours CBC:  Recent Labs Lab 09/28/13 2036  WBC 8.5  NEUTROABS 6.9  HGB 13.7  HCT 39.8  MCV 85.0  PLT 193   Cardiac Enzymes: No results found for this basename: CKTOTAL, CKMB, CKMBINDEX, TROPONINI,  in the last 168 hours BNP (last 3 results) No results found for this basename: PROBNP,  in the last 8760 hours CBG:  Recent Labs Lab 09/28/13 2012 09/29/13 0702 09/29/13 1211  GLUCAP 181* 111* 101*    No results found for this or any previous visit (from the past 240 hour(s)).   Studies: Dg Chest 2 View  09/28/2013   CLINICAL DATA:  Altered mental status.  EXAM: CHEST  2 VIEW  COMPARISON:  Report dated 08/05/2000.  FINDINGS: Normal sized heart. Clear lungs with normal vascularity. Thoracolumbar spine degenerative changes.  IMPRESSION: No acute abnormality.   Electronically Signed    By: Ronald [name] M.D.   On: 09/28/2013 21:16   Ct Head Wo Contrast  09/28/2013   CLINICAL DATA:  Altered mental status  EXAM: CT HEAD WITHOUT CONTRAST  TECHNIQUE: Contiguous axial images were obtained from the base of the skull through the vertex without intravenous contrast.  COMPARISON:  None.  FINDINGS: No evidence of parenchymal hemorrhage or extra-axial fluid collection. No mass lesion, mass effect, or midline shift.  No CT evidence of acute infarction.  Subcortical white matter and periventricular small vessel ischemic changes.  Cerebral volume is within normal limits.  No ventriculomegaly.  Partial opacification of the bilateral maxillary sinusitis. Mastoid air cells are clear.  No evidence of calvarial fracture.  IMPRESSION: No evidence of acute intracranial abnormality.  Small vessel ischemic changes.   Electronically Signed   By: Julian Hy M.D.   On: 09/28/2013 21:02   Mr Brain Wo Contrast  09/29/2013   CLINICAL DATA:  Altered mental status. Loss of consciousness. Episodic confusion.  EXAM: MRI HEAD WITHOUT CONTRAST  TECHNIQUE: Multiplanar, multiecho pulse sequences of the brain and surrounding structures were obtained without intravenous contrast.  COMPARISON:  CT head 09/28/2013.  FINDINGS: No evidence for acute infarction, hemorrhage, mass lesion, hydrocephalus, or extra-axial fluid. Normal cerebral volume. Moderate subcortical and periventricular T2 and FLAIR hyperintensities, likely chronic microvascular ischemic change. Normal pituitary. No tonsillar herniation. Mild pannus. No temporal lobe abnormality. Mild chronic sinus disease. Negative orbits. Negative osseous structures. No mastoid fluid.  IMPRESSION: Chronic microvascular ischemic change. No acute intracranial findings.   Electronically Signed   By: Rolla Flatten M.D.   On: 09/29/2013 13:03    Scheduled Meds: . aspirin  81 mg Oral Daily  . heparin  5,000 Units Subcutaneous 3 times per day  . levETIRAcetam  500 mg Oral BID  .  metoprolol tartrate  12.5 mg Oral BID  . sodium chloride  3 mL Intravenous Q12H   Continuous Infusions:     Time spent: 25 minutes    Ronald [name]  [facility] Pager [phone]. If 7PM-7AM, please contact night-coverage at [email or web address], password [facility] 09/29/2013, 2:10 PM  LOS: 1 day

## 2013-09-29 NOTE — Progress Notes (Signed)
Subjective: No recurrence of seizure activity reported. Patient had no new complaints.  Objective: Current vital signs: BP 157/85  Pulse 71  Temp(Src) 98.1 F (36.7 C) (Oral)  Resp 18  Ht 5\' 7"  (1.702 m)  Wt 69.718 kg (153 lb 11.2 oz)  BMI 24.07 kg/m2  SpO2 98%  Neurologic Exam: Patient is alert and in no acute distress. Mental status was normal. Extraocular movements were full and conjugate. Visual fields were intact and normal No facial weakness noted. Motor exam was normal throughout. Coordination was normal.  Medications: I have reviewed the patient's current medications.  Assessment/Plan: 68 year old man presenting with new onset focal seizures. Etiology is unclear. Neurological examination is unremarkable and CT scan of his head was unremarkable. EEG and MRI brain attending.  No changes recommended in current management. We'll continue to follow this patient with you.  C.R. Nicole Kindred, MD [facility] [phone]  09/29/2013  9:55 AM

## 2013-09-30 ENCOUNTER — Inpatient Hospital Stay (HOSPITAL_COMMUNITY): Payer: Medicare Other

## 2013-09-30 DIAGNOSIS — E876 Hypokalemia: Secondary | ICD-10-CM

## 2013-09-30 DIAGNOSIS — I10 Essential (primary) hypertension: Secondary | ICD-10-CM

## 2013-09-30 LAB — LIPID PANEL
Cholesterol: 166 mg/dL (ref 0–200)
HDL: 51 mg/dL (ref >39)
LDL Cholesterol: 99 mg/dL (ref 0–99)
TRIGLYCERIDES: 80 mg/dL (ref <150)
Total CHOL/HDL Ratio: 3.3 RATIO
VLDL: 16 mg/dL (ref 0–40)

## 2013-09-30 LAB — GLUCOSE, CAPILLARY
GLUCOSE-CAPILLARY: 103 mg/dL — AB (ref 70–99)
Glucose-Capillary: 107 mg/dL — ABNORMAL HIGH (ref 70–99)
Glucose-Capillary: 81 mg/dL (ref 70–99)
Glucose-Capillary: 105 mg/dL — ABNORMAL HIGH (ref 70–99)

## 2013-09-30 LAB — HEMOGLOBIN A1C
HEMOGLOBIN A1C: 6.4 % — AB (ref <5.7)
Mean Plasma Glucose: 137 mg/dL — ABNORMAL HIGH (ref <117)

## 2013-09-30 MED ORDER — POTASSIUM CHLORIDE CRYS ER 20 MEQ PO TBCR: 20.0000 meq | EXTENDED_RELEASE_TABLET | Freq: Once | ORAL | Status: DC

## 2013-09-30 MED ORDER — METOPROLOL TARTRATE 25 MG PO TABS: 25.0000 mg | ORAL_TABLET | Freq: Two times a day (BID) | ORAL | Status: AC

## 2013-09-30 MED ORDER — LEVETIRACETAM 500 MG PO TABS: 500.0000 mg | ORAL_TABLET | Freq: Two times a day (BID) | ORAL | Status: AC

## 2013-09-30 NOTE — Progress Notes (Signed)
NEURO HOSPITALIST PROGRESS NOTE   SUBJECTIVE:                                                                                                                         No further seizures over night.  No complaints.  OBJECTIVE:                                                                                                                           Vital signs in last 24 hours: Temp:  [98 F (36.7 C)-99.3 F (37.4 C)] 98.6 F (37 C) (05/04 0557) Pulse Rate:  [51-96] 51 (05/04 0557) Resp:  [18] 18 (05/04 0557) BP: (117-182)/(68-81) 146/78 mmHg (05/04 0557) SpO2:  [99 %-100 %] 99 % (05/04 0557)  Intake/Output from previous day: 05/03 0701 - 05/04 0700 In: 120 [P.O.:120] Out: 700 [Urine:700] Intake/Output this shift:   Nutritional status: Carb Control  Past Medical History  Diagnosis Date  . Stroke       Neurologic Exam:  Mental Status: Alert, oriented, thought content appropriate.  Speech fluent without evidence of aphasia.  Able to follow 3 step commands without difficulty. Cranial Nerves: II:  Visual fields grossly normal, pupils equal, round, reactive to light and accommodation III,IV, VI: ptosis not present, extra-ocular motions intact bilaterally V,VII: smile symmetric, facial light touch sensation normal bilaterally VIII: hearing normal bilaterally IX,X: gag reflex present XI: bilateral shoulder shrug XII: midline tongue extension without atrophy or fasciculations  Motor: Right : Upper extremity   5/5    Left:     Upper extremity   5/5  Lower extremity   5/5     Lower extremity   5/5 Tone and bulk:normal tone throughout; no atrophy noted Sensory: Pinprick and light touch intact throughout, bilaterally Deep Tendon Reflexes:  Right: Upper Extremity   Left: Upper extremity   biceps (C-5 to C-6) 2/4   biceps (C-5 to C-6) 2/4 tricep (C7) 2/4    triceps (C7) 2/4 Brachioradialis (C6) 2/4  Brachioradialis (C6) 2/4  Lower  Extremity Lower Extremity  quadriceps (L-2 to L-4) 2/4   quadriceps (L-2 to L-4) 2/4 Achilles (S1) 1/4   Achilles (S1) 1/4  Plantars: Right: downgoing   Left: downgoing Cerebellar: normal finger-to-nose,  normal heel-to-shin test     Lab Results: Basic Metabolic Panel:  Recent Labs Lab 09/28/13 2036  NA 141  K 3.4*  CL 105  CO2 25  GLUCOSE 160*  BUN 17  CREATININE 0.85  CALCIUM 8.8    Liver Function Tests:  Recent Labs Lab 09/28/13 2036  AST 20  ALT 13  ALKPHOS 88  BILITOT 0.7  PROT 7.1  ALBUMIN 3.4*   No results found for this basename: LIPASE, AMYLASE,  in the last 168 hours No results found for this basename: AMMONIA,  in the last 168 hours  CBC:  Recent Labs Lab 09/28/13 2036  WBC 8.5  NEUTROABS 6.9  HGB 13.7  HCT 39.8  MCV 85.0  PLT 193    Cardiac Enzymes: No results found for this basename: CKTOTAL, CKMB, CKMBINDEX, TROPONINI,  in the last 168 hours  Lipid Panel:  Recent Labs Lab 09/30/13 0356  CHOL 166  TRIG 80  HDL 51  CHOLHDL 3.3  VLDL 16  LDLCALC 99    CBG:  Recent Labs Lab 09/28/13 2012 09/29/13 0702 09/29/13 1211 09/29/13 1633 09/29/13 2158  GLUCAP 181* 111* 101* 107* 100*    Microbiology: No results found for this or any previous visit.  Coagulation Studies: No results found for this basename: LABPROT, INR,  in the last 72 hours  Imaging: Dg Chest 2 View  09/28/2013   CLINICAL DATA:  Altered mental status.  EXAM: CHEST  2 VIEW  COMPARISON:  Report dated 08/05/2000.  FINDINGS: Normal sized heart. Clear lungs with normal vascularity. Thoracolumbar spine degenerative changes.  IMPRESSION: No acute abnormality.   Electronically Signed   By: Enrique Sack M.D.   On: 09/28/2013 21:16   Ct Head Wo Contrast  09/28/2013   CLINICAL DATA:  Altered mental status  EXAM: CT HEAD WITHOUT CONTRAST  TECHNIQUE: Contiguous axial images were obtained from the base of the skull through the vertex without intravenous contrast.   COMPARISON:  None.  FINDINGS: No evidence of parenchymal hemorrhage or extra-axial fluid collection. No mass lesion, mass effect, or midline shift.  No CT evidence of acute infarction.  Subcortical white matter and periventricular small vessel ischemic changes.  Cerebral volume is within normal limits.  No ventriculomegaly.  Partial opacification of the bilateral maxillary sinusitis. Mastoid air cells are clear.  No evidence of calvarial fracture.  IMPRESSION: No evidence of acute intracranial abnormality.  Small vessel ischemic changes.   Electronically Signed   By: Julian Hy M.D.   On: 09/28/2013 21:02   Mr Brain Wo Contrast  09/29/2013   CLINICAL DATA:  Altered mental status. Loss of consciousness. Episodic confusion.  EXAM: MRI HEAD WITHOUT CONTRAST  TECHNIQUE: Multiplanar, multiecho pulse sequences of the brain and surrounding structures were obtained without intravenous contrast.  COMPARISON:  CT head 09/28/2013.  FINDINGS: No evidence for acute infarction, hemorrhage, mass lesion, hydrocephalus, or extra-axial fluid. Normal cerebral volume. Moderate subcortical and periventricular T2 and FLAIR hyperintensities, likely chronic microvascular ischemic change. Normal pituitary. No tonsillar herniation. Mild pannus. No temporal lobe abnormality. Mild chronic sinus disease. Negative orbits. Negative osseous structures. No mastoid fluid.  IMPRESSION: Chronic microvascular ischemic change. No acute intracranial findings.   Electronically Signed   By: Rolla Flatten M.D.   On: 09/29/2013 13:03       MEDICATIONS  Scheduled: . aspirin  81 mg Oral Daily  . heparin  5,000 Units Subcutaneous 3 times per day  . levETIRAcetam  500 mg Oral BID  . metoprolol tartrate  25 mg Oral BID  . sodium chloride  3 mL Intravenous Q12H    ASSESSMENT/PLAN:                                                                                                             68 YO male with new onset seizure.  MRI showed no etiology for seizure.  EEG is pending.  If EEG is normal would continue Keppra 500 mg BID on discharge and have patient follow up with out patient neurology in 3-4 weeks.   No driving, operating heavy machinery, perform activities at heights, swimming or participation in water activities until release by outpatient physician.  This has been discussed with patient.    IF EEG shows no abnormalities neurology will S/O  Assessment and plan discussed with with attending physician and they are in agreement.    Etta Quill PA-C [facility] [phone]  09/30/2013, 9:59 AM

## 2013-09-30 NOTE — Procedures (Signed)
ELECTROENCEPHALOGRAM REPORT  Patient: Ronald [name]       Room #: 5Y09  EEG No. ID: [id number] Age: 68 y.o.        Sex: male Referring Physician: Hongalgi Report Date:  09/30/2013        Interpreting Physician: Wallie Char  History: [name] is an 68 y.o. male with new onset partial seizures.  Indications for study:  Rule out seizure activity.  Technique: This is an 18 channel routine scalp EEG performed at the bedside with bipolar and monopolar montages arranged in accordance to the international 10/20 system of electrode placement.   Description: This EEG was recorded during wakefulness and during a brief period of sleep. Background activity during wakefulness consists of 10 Hz symmetrical alpha rhythm which granulated well with eye opening. Photic stimulation produced a symmetrical occipital driving response. Hyperventilation was not performed. During sleep there was symmetrical slowing of background activity diffusely and symmetrically. Normal sleep spindles and arousal responses as well as vertex waves were recorded during stage II of sleep. No epileptiform discharges were recorded. There was no abnormal slowing of cerebral activity.   Interpretation:  this is a normal EEG recording during wakefulness and during a brief period of sleep. No focal or generalized epileptiform activity was recorded during this study.    Rush Farmer M.D. [facility] [phone]

## 2013-09-30 NOTE — Evaluation (Signed)
Physical Therapy Evaluation/ Discharge Patient Details Name: Wilton Thrall Strieter MRN: [id number] DOB: 29-May-1946 Today's Date: 09/30/2013   History of Present Illness  Elyon Zoll Carothers is a 68 y.o. male with AMS.   10 seconds or less of unresponsiveness, followed by confusion, followed by return to normal mental status on 5/2 and throughout the day occuring every 2-3 mins  Clinical Impression  Pt is a very pleasant gentleman who retired at 66 after working 16hr days. Pt reports only one fall and that was related to snow. Pt with antalgic gait with decreased right knee flexion but bil LE all myotomes 5/5 and pt reports as habitual gait after CVA. Pt without noted deficits in vision currently able track bilaterally and no nystagmus. Pt at baseline functional status encouraged to continue mobility and pt states he is planning to join a nearby gym. Pt without further needs and agreeable, will sign off.     Follow Up Recommendations No PT follow up    Equipment Recommendations  None recommended by PT    Recommendations for Other Services       Precautions / Restrictions Precautions Precautions: None      Mobility  Bed Mobility Overal bed mobility: Modified Independent                Transfers Overall transfer level: Modified independent                  Ambulation/Gait Ambulation/Gait assistance: Modified independent (Device/Increase time) Ambulation Distance (Feet): 400 Feet Assistive device: None Gait Pattern/deviations: Antalgic   Gait velocity interpretation: at or above normal speed for age/gender General Gait Details: Pt with decreased knee flexion RLE with gait, no pain and reports this has been his gait pattern for 58yrs after a CVA  Stairs Stairs: Yes   Stair Management: One rail Right;Forwards;Alternating pattern Number of Stairs: 5    Wheelchair Mobility    Modified Rankin (Stroke Patients Only)       Balance Overall balance assessment: No  apparent balance deficits (not formally assessed)                                           Pertinent Vitals/Pain No pain    Home Living Family/patient expects to be discharged to:: Private residence Living Arrangements: Alone Available Help at Discharge: Family;Available PRN/intermittently Type of Home: House Home Access: Stairs to enter Entrance Stairs-Rails: Right;Left;Can reach both Entrance Stairs-Number of Steps: 3 Home Layout: One level Home Equipment: None      Prior Function Level of Independence: Independent               Hand Dominance        Extremity/Trunk Assessment   Upper Extremity Assessment: Overall WFL for tasks assessed           Lower Extremity Assessment: Overall WFL for tasks assessed      Cervical / Trunk Assessment: Normal  Communication   Communication: No difficulties  Cognition Arousal/Alertness: Awake/alert Behavior During Therapy: WFL for tasks assessed/performed Overall Cognitive Status: Within Functional Limits for tasks assessed                      General Comments      Exercises        Assessment/Plan    PT Assessment Patent does not need any further PT services  PT Diagnosis  PT Problem List    PT Treatment Interventions     PT Goals (Current goals can be found in the Care Plan section) Acute Rehab PT Goals PT Goal Formulation: No goals set, d/c therapy    Frequency     Barriers to discharge        Co-evaluation               End of Session   Activity Tolerance: Patient tolerated treatment well Patient left: in chair;with call bell/phone within reach;with chair alarm set           Time: 5784-6962 PT Time Calculation (min): 18 min   Charges:   PT Evaluation $Initial PT Evaluation Tier I: 1 Procedure     PT G Codes:          Linus Weckerly B Binyamin Nelis 09/30/2013, 9:23 AM Elwyn Reach, [place]

## 2013-09-30 NOTE — Care Management Note (Signed)
    [place] [facility]   09/30/2013     3:48:25 PM CARE MANAGEMENT NOTE 09/30/2013  Patient:  Ronald [name], Ronald [name]   Account Number:  [id number]  Date Initiated:  09/30/2013  Documentation initiated by:  Lorne Skeens  Subjective/Objective Assessment:   Patient was admitted with complex partial seizures. Lives at home alone.     Action/Plan:   Will follow for discharge needs   Anticipated DC Date:  09/30/2013   Anticipated DC Plan:  [place]  CM consult      Choice offered to / List presented to:             Status of service:  Completed, signed off Medicare Important Message given?  YES (If response is "NO", the following Medicare IM given date fields will be blank) Date Medicare IM given:   Date Additional Medicare IM given:  09/30/2013  Discharge Disposition:  HOME/SELF CARE  Per UR Regulation:  Reviewed for med. necessity/level of care/duration of stay  If discussed at [place] of Stay Meetings, dates discussed:    Comments:  09/30/13 [place], MSN, CM- Met with patient to discuss obtaining a PCP.  [facility] number was provided and patient was encouraged to call immediately to begin the process of finding a PCP for hospital follow-up. Patient verbalized understanding. Medicare IM given.

## 2013-09-30 NOTE — Progress Notes (Signed)
Utilization Review Completed.Neoma Laming T Dowell5/08/2013

## 2013-09-30 NOTE — Discharge Summary (Signed)
Physician Discharge Summary  Davaris Youtsey Hast RCV:[id number] DOB: [birth year] DOA: 09/28/2013  PCP: No primary provider on file.  Admit date: 09/28/2013 Discharge date: 09/30/2013  Time spent: Less than 30 minutes  Recommendations for Outpatient Follow-up:  1. PCP in 2 weeks 2. [facility], in 3 weeks  Discharge Diagnoses:  Active Problems:   Complex partial status epilepticus   Elevated blood pressure   Glucosuria   Discharge Condition: Improved & Stable  Diet recommendation: Heart healthy diet  Filed Weights   09/29/13 0030  Weight: 69.718 kg (153 lb 11.2 oz)    History of present illness and hospital course:  68 year old male patient with prior history of stroke, presented with recurrent episodes of altered mental status over the course of an hour. His friend described that he felt nauseous, then became unresponsive for brief periods of time. He had right head turning with right eye deviation as well as right arm automatisms. He would then certainly come back to being able to interact. These were all fairly brief but he had recurrent episodes repeatedly over the course of an hour without coming back completely to his normal self. Seizures were suspected. Neurology was consulted. CT head was unremarkable. Neurology felt that he had complex partial status epilepticus which resolved after small dose of Ativan. He was admitted and loaded with 1 g of Keppra. No further seizure like activity. MRI brain showed no etiology for seizure. EEG was normal without seizure like activity. Neurology has seen the patient today and have cleared him for discharge home on Keppra 500 mg twice a day with outpatient followup with neurology in 3-4 weeks. No driving, operating heavy machinery, perform activities at heights, swimming or participation in water activities until release by outpatient physician. This has been discussed with patient by the neurologist and this M.D. Patient was started on  metoprolol for newly diagnosed hypertension with reasonable inpatient control. This may need further titration as outpatient. Hemoglobin A1c 6.4 suggesting glucose intolerance-recommend diet and exercise and rechecking in a couple of months. Patient also seems to have dyslipidemia which will need rechecking after a couple of months of diet and exercise and may need to start statins-will defer to outpatient PCP.   Consultations:  Neurology  Procedures:  EEG 09/30/13: Interpretation: this is a normal EEG recording during wakefulness and during a brief period of sleep. No focal or generalized epileptiform activity was recorded during this study.     Discharge Exam:  Complaints:  Patient denies complaints. No seizure like activity.  Filed Vitals:   09/30/13 0153 09/30/13 0557 09/30/13 1049 09/30/13 1417  BP: 117/68 146/78 152/74 175/86  Pulse: 54 51 70 64  Temp: 98.2 F (36.8 C) 98.6 F (37 C) 97.6 F (36.4 C) 97.3 F (36.3 C)  TempSrc: Oral Oral Oral Oral  Resp: 18 18 18 18   Height:      Weight:      SpO2: 100% 99% 98% 93%    General exam: Pleasant middle-aged male lying comfortably in bed. Respiratory system: Clear. No increased work of breathing. Cardiovascular system: S1 & S2 heard, RRR. No JVD, murmurs, gallops, clicks or pedal edema. Telemetry: Sinus rhythm Gastrointestinal system: Abdomen is nondistended, soft and nontender. Normal bowel sounds heard. Central nervous system: Alert and oriented. No focal neurological deficits. Extremities: Symmetric 5 x 5 power.  Discharge Instructions      Discharge Orders   Future Orders Complete By Expires   Call MD for:  As directed    Diet -  low sodium heart healthy  As directed    Discharge instructions  As directed    Increase activity slowly  As directed        Medication List         levETIRAcetam 500 MG tablet  Commonly known as:  KEPPRA  Take 1 tablet (500 mg total) by mouth 2 (two) times daily.     metoprolol  tartrate 25 MG tablet  Commonly known as:  LOPRESSOR  Take 1 tablet (25 mg total) by mouth 2 (two) times daily.     naproxen sodium 220 MG tablet  Commonly known as:  ANAPROX  Take 220 mg by mouth daily as needed (headache).       Follow-up Information   Follow up with Primary Medical Doctor. Schedule an appointment as soon as possible for a visit in 2 weeks.      Follow up with [facility]. Schedule an appointment as soon as possible for a visit in 3 weeks.   Contact information:   [address] [place] [address] [phone]       The results of significant diagnostics from this hospitalization (including imaging, microbiology, ancillary and laboratory) are listed below for reference.    Significant Diagnostic Studies: Dg Chest 2 View  09/28/2013   CLINICAL DATA:  Altered mental status.  EXAM: CHEST  2 VIEW  COMPARISON:  Report dated 08/05/2000.  FINDINGS: Normal sized heart. Clear lungs with normal vascularity. Thoracolumbar spine degenerative changes.  IMPRESSION: No acute abnormality.   Electronically Signed   By: Enrique Sack M.D.   On: 09/28/2013 21:16   Ct Head Wo Contrast  09/28/2013   CLINICAL DATA:  Altered mental status  EXAM: CT HEAD WITHOUT CONTRAST  TECHNIQUE: Contiguous axial images were obtained from the base of the skull through the vertex without intravenous contrast.  COMPARISON:  None.  FINDINGS: No evidence of parenchymal hemorrhage or extra-axial fluid collection. No mass lesion, mass effect, or midline shift.  No CT evidence of acute infarction.  Subcortical white matter and periventricular small vessel ischemic changes.  Cerebral volume is within normal limits.  No ventriculomegaly.  Partial opacification of the bilateral maxillary sinusitis. Mastoid air cells are clear.  No evidence of calvarial fracture.  IMPRESSION: No evidence of acute intracranial abnormality.  Small vessel ischemic changes.   Electronically Signed   By:  Julian Hy M.D.   On: 09/28/2013 21:02   Mr Brain Wo Contrast  09/29/2013   CLINICAL DATA:  Altered mental status. Loss of consciousness. Episodic confusion.  EXAM: MRI HEAD WITHOUT CONTRAST  TECHNIQUE: Multiplanar, multiecho pulse sequences of the brain and surrounding structures were obtained without intravenous contrast.  COMPARISON:  CT head 09/28/2013.  FINDINGS: No evidence for acute infarction, hemorrhage, mass lesion, hydrocephalus, or extra-axial fluid. Normal cerebral volume. Moderate subcortical and periventricular T2 and FLAIR hyperintensities, likely chronic microvascular ischemic change. Normal pituitary. No tonsillar herniation. Mild pannus. No temporal lobe abnormality. Mild chronic sinus disease. Negative orbits. Negative osseous structures. No mastoid fluid.  IMPRESSION: Chronic microvascular ischemic change. No acute intracranial findings.   Electronically Signed   By: Rolla Flatten M.D.   On: 09/29/2013 13:03    Microbiology: No results found for this or any previous visit (from the past 240 hour(s)).   Labs: Basic Metabolic Panel:  Recent Labs Lab 09/28/13 2036  NA 141  K 3.4*  CL 105  CO2 25  GLUCOSE 160*  BUN 17  CREATININE 0.85  CALCIUM  8.8   Liver Function Tests:  Recent Labs Lab 09/28/13 2036  AST 20  ALT 13  ALKPHOS 88  BILITOT 0.7  PROT 7.1  ALBUMIN 3.4*   No results found for this basename: LIPASE, AMYLASE,  in the last 168 hours No results found for this basename: AMMONIA,  in the last 168 hours CBC:  Recent Labs Lab 09/28/13 2036  WBC 8.5  NEUTROABS 6.9  HGB 13.7  HCT 39.8  MCV 85.0  PLT 193   Cardiac Enzymes: No results found for this basename: CKTOTAL, CKMB, CKMBINDEX, TROPONINI,  in the last 168 hours BNP: BNP (last 3 results) No results found for this basename: PROBNP,  in the last 8760 hours CBG:  Recent Labs Lab 09/29/13 1211 09/29/13 1633 09/29/13 2158 09/30/13 0632 09/30/13 1133  GLUCAP 101* 107* 100* 103* 81     Additional labs: 1. Fasting lipids: Cholesterol 166, triglycerides 80, HDL 51, LDL 99 and VLDL 16 2. Hemoglobin A1c: 6.4 3. Blood alcohol level <11 4. UDS: Negative   Signed:  Modena Jansky, MD, FACP, FHM. [facility] Pager [phone]  If 7PM-7AM, please contact night-coverage [email or web address] Password [email or web address] 09/30/2013, 2:22 PM

## 2013-09-30 NOTE — Progress Notes (Signed)
STAT EEG completed, results pending  

## 2013-11-01 ENCOUNTER — Ambulatory Visit: Payer: Medicare Other | Admitting: Neurology

## 2013-11-08 ENCOUNTER — Emergency Department (HOSPITAL_COMMUNITY)
Admission: EM | Admit: 2013-11-08 | Discharge: 2013-11-08 | Disposition: A | Discharge status: Home / Self Care | Payer: Medicare Other | Attending: Emergency Medicine | Admitting: Emergency Medicine

## 2013-11-08 ENCOUNTER — Ambulatory Visit (INDEPENDENT_AMBULATORY_CARE_PROVIDER_SITE_OTHER): Payer: Medicare Other | Admitting: Neurology

## 2013-11-08 ENCOUNTER — Encounter (HOSPITAL_COMMUNITY): Payer: Self-pay | Admitting: Emergency Medicine

## 2013-11-08 ENCOUNTER — Encounter (INDEPENDENT_AMBULATORY_CARE_PROVIDER_SITE_OTHER): Payer: Self-pay

## 2013-11-08 ENCOUNTER — Encounter: Payer: Self-pay | Admitting: Neurology

## 2013-11-08 VITALS — BP 201/92 | HR 73 | Ht 65.0 in | Wt 155.0 lb

## 2013-11-08 DIAGNOSIS — R569 Unspecified convulsions: Secondary | ICD-10-CM

## 2013-11-08 DIAGNOSIS — I10 Essential (primary) hypertension: Secondary | ICD-10-CM

## 2013-11-08 DIAGNOSIS — Z79899 Other long term (current) drug therapy: Secondary | ICD-10-CM | POA: Insufficient documentation

## 2013-11-08 DIAGNOSIS — Z8673 Personal history of transient ischemic attack (TIA), and cerebral infarction without residual deficits: Secondary | ICD-10-CM | POA: Insufficient documentation

## 2013-11-08 DIAGNOSIS — Z7982 Long term (current) use of aspirin: Secondary | ICD-10-CM | POA: Insufficient documentation

## 2013-11-08 DIAGNOSIS — I6350 Cerebral infarction due to unspecified occlusion or stenosis of unspecified cerebral artery: Secondary | ICD-10-CM

## 2013-11-08 DIAGNOSIS — I639 Cerebral infarction, unspecified: Secondary | ICD-10-CM

## 2013-11-08 LAB — CBC WITH DIFFERENTIAL/PLATELET
BASOS ABS: 0 10*3/uL (ref 0.0–0.1)
BASOS PCT: 0 % (ref 0–1)
EOS ABS: 0.1 10*3/uL (ref 0.0–0.7)
Eosinophils Relative: 2 % (ref 0–5)
HEMATOCRIT: 43.3 % (ref 39.0–52.0)
Hemoglobin: 14.7 g/dL (ref 13.0–17.0)
Lymphocytes Relative: 31 % (ref 12–46)
Lymphs Abs: 2.3 10*3/uL (ref 0.7–4.0)
MCH: 28.4 pg (ref 26.0–34.0)
MCHC: 33.9 g/dL (ref 30.0–36.0)
MCV: 83.8 fL (ref 78.0–100.0)
MONO ABS: 0.4 10*3/uL (ref 0.1–1.0)
Monocytes Relative: 6 % (ref 3–12)
NEUTROS PCT: 61 % (ref 43–77)
Neutro Abs: 4.4 10*3/uL (ref 1.7–7.7)
Platelets: 236 10*3/uL (ref 150–400)
RBC: 5.17 MIL/uL (ref 4.22–5.81)
RDW: 13 % (ref 11.5–15.5)
WBC: 7.2 10*3/uL (ref 4.0–10.5)

## 2013-11-08 LAB — BASIC METABOLIC PANEL
BUN: 12 mg/dL (ref 6–23)
CHLORIDE: 101 meq/L (ref 96–112)
CO2: 28 mEq/L (ref 19–32)
CREATININE: 0.82 mg/dL (ref 0.50–1.35)
Calcium: 9.6 mg/dL (ref 8.4–10.5)
GFR, EST NON AFRICAN AMERICAN: 89 mL/min — AB (ref >90)
Glucose, Bld: 112 mg/dL — ABNORMAL HIGH (ref 70–99)
Potassium: 4 mEq/L (ref 3.7–5.3)
Sodium: 140 mEq/L (ref 137–147)

## 2013-11-08 LAB — I-STAT TROPONIN, ED: TROPONIN I, POC: 0 ng/mL (ref 0.00–0.08)

## 2013-11-08 MED ORDER — HYDROCHLOROTHIAZIDE 12.5 MG PO CAPS
25.0000 mg | ORAL_CAPSULE | Freq: Once | ORAL | Status: AC
  Administered 2013-11-08: 25 mg via ORAL
  Filled 2013-11-08: qty 2

## 2013-11-08 MED ORDER — ASPIRIN EC 81 MG PO TBEC: 81.0000 mg | DELAYED_RELEASE_TABLET | Freq: Every day | ORAL | Status: AC

## 2013-11-08 MED ORDER — HYDROCHLOROTHIAZIDE 25 MG PO TABS: 25.0000 mg | ORAL_TABLET | Freq: Every day | ORAL | Status: DC

## 2013-11-08 MED ORDER — METOPROLOL TARTRATE 25 MG PO TABS: 25.0000 mg | ORAL_TABLET | Freq: Once | ORAL | Status: DC

## 2013-11-08 NOTE — ED Provider Notes (Signed)
Medical screening examination/treatment/procedure(s) were performed by non-physician practitioner and as supervising physician I was immediately available for consultation/collaboration.   EKG Interpretation   Date/Time:  Friday November 08 2013 15:27:13 EDT Ventricular Rate:  55 PR Interval:  195 QRS Duration: 87 QT Interval:  404 QTC Calculation: 386 R Axis:   29 Text Interpretation:  Sinus rhythm Ventricular premature complex Baseline  wander in lead(s) I III aVL No significant change since last tracing  Confirmed by YAO  MD, DAVID ([id number]) on 11/08/2013 3:37:03 PM        Wandra Arthurs, MD 11/08/13 2321

## 2013-11-08 NOTE — Discharge Instructions (Signed)
Continue all regular medications. Take hydrochlorothiazide as prescribed, with next dose tomorrow. Follow up with primary care doctor as scheduled.    Arterial Hypertension Arterial hypertension (high blood pressure) is a condition of elevated pressure in your blood vessels. Hypertension over a long period of time is a risk factor for strokes, heart attacks, and heart failure. It is also the leading cause of kidney (renal) failure.  CAUSES   In Adults -- Over 90% of all hypertension has no known cause. This is called essential or primary hypertension. In the other 10% of people with hypertension, the increase in blood pressure is caused by another disorder. This is called secondary hypertension. Important causes of secondary hypertension are:  Heavy alcohol use.  Obstructive sleep apnea.  Hyperaldosterosim (Conn's syndrome).  Steroid use.  Chronic kidney failure.  Hyperparathyroidism.  Medications.  Renal artery stenosis.  Pheochromocytoma.  Cushing's disease.  Coarctation of the aorta.  Scleroderma renal crisis.  Licorice (in excessive amounts).  Drugs (cocaine, methamphetamine). Your caregiver can explain any items above that apply to you.  In Children -- Secondary hypertension is more common and should always be considered.  Pregnancy -- Few women of childbearing age have high blood pressure. However, up to 10% of them develop hypertension of pregnancy. Generally, this will not harm the woman. It may be a sign of 3 complications of pregnancy: preeclampsia, HELLP syndrome, and eclampsia. Follow up and control with medication is necessary. SYMPTOMS   This condition normally does not produce any noticeable symptoms. It is usually found during a routine exam.  Malignant hypertension is a late problem of high blood pressure. It may have the following symptoms:  Headaches.  Blurred vision.  End-organ damage (this means your kidneys, heart, lungs, and other organs are  being damaged).  Stressful situations can increase the blood pressure. If a person with normal blood pressure has their blood pressure go up while being seen by their caregiver, this is often termed "white coat hypertension." Its importance is not known. It may be related with eventually developing hypertension or complications of hypertension.  Hypertension is often confused with mental tension, stress, and anxiety. DIAGNOSIS  The diagnosis is made by 3 separate blood pressure measurements. They are taken at least 1 week apart from each other. If there is organ damage from hypertension, the diagnosis may be made without repeat measurements. Hypertension is usually identified by having blood pressure readings:  Above 140/90 mmHg measured in both arms, at 3 separate times, over a couple weeks.  Over 130/80 mmHg should be considered a risk factor and may require treatment in patients with diabetes. Blood pressure readings over 120/80 mmHg are called "pre-hypertension" even in non-diabetic patients. To get a true blood pressure measurement, use the following guidelines. Be aware of the factors that can alter blood pressure readings.  Take measurements at least 1 hour after caffeine.  Take measurements 30 minutes after smoking and without any stress. This is another reason to quit smoking  it raises your blood pressure.  Use a proper cuff size. Ask your caregiver if you are not sure about your cuff size.  Most home blood pressure cuffs are automatic. They will measure systolic and diastolic pressures. The systolic pressure is the pressure reading at the start of sounds. Diastolic pressure is the pressure at which the sounds disappear. If you are elderly, measure pressures in multiple postures. Try sitting, lying or standing.  Sit at rest for a minimum of 5 minutes before taking measurements.  You  should not be on any medications like decongestants. These are found in many cold  medications.  Record your blood pressure readings and review them with your caregiver. If you have hypertension:  Your caregiver may do tests to be sure you do not have secondary hypertension (see "causes" above).  Your caregiver may also look for signs of metabolic syndrome. This is also called Syndrome X or Insulin Resistance Syndrome. You may have this syndrome if you have type 2 diabetes, abdominal obesity, and abnormal blood lipids in addition to hypertension.  Your caregiver will take your medical and family history and perform a physical exam.  Diagnostic tests may include blood tests (for glucose, cholesterol, potassium, and kidney function), a urinalysis, or an EKG. Other tests may also be necessary depending on your condition. PREVENTION  There are important lifestyle issues that you can adopt to reduce your chance of developing hypertension:  Maintain a normal weight.  Limit the amount of salt (sodium) in your diet.  Exercise often.  Limit alcohol intake.  Get enough potassium in your diet. Discuss specific advice with your caregiver.  Follow a DASH diet (dietary approaches to stop hypertension). This diet is rich in fruits, vegetables, and low-fat dairy products, and avoids certain fats. PROGNOSIS  Essential hypertension cannot be cured. Lifestyle changes and medical treatment can lower blood pressure and reduce complications. The prognosis of secondary hypertension depends on the underlying cause. Many people whose hypertension is controlled with medicine or lifestyle changes can live a normal, healthy life.  RISKS AND COMPLICATIONS  While high blood pressure alone is not an illness, it often requires treatment due to its short- and long-term effects on many organs. Hypertension increases your risk for:  CVAs or strokes (cerebrovascular accident).  Heart failure due to chronically high blood pressure (hypertensive cardiomyopathy).  Heart attack (myocardial  infarction).  Damage to the retina (hypertensive retinopathy).  Kidney failure (hypertensive nephropathy). Your caregiver can explain list items above that apply to you. Treatment of hypertension can significantly reduce the risk of complications. TREATMENT   For overweight patients, weight loss and regular exercise are recommended. Physical fitness lowers blood pressure.  Mild hypertension is usually treated with diet and exercise. A diet rich in fruits and vegetables, fat-free dairy products, and foods low in fat and salt (sodium) can help lower blood pressure. Decreasing salt intake decreases blood pressure in a 1/3 of people.  Stop smoking if you are a smoker. The steps above are highly effective in reducing blood pressure. While these actions are easy to suggest, they are difficult to achieve. Most patients with moderate or severe hypertension end up requiring medications to bring their blood pressure down to a normal level. There are several classes of medications for treatment. Blood pressure pills (antihypertensives) will lower blood pressure by their different actions. Lowering the blood pressure by 10 mmHg may decrease the risk of complications by as much as 25%. The goal of treatment is effective blood pressure control. This will reduce your risk for complications. Your caregiver will help you determine the best treatment for you according to your lifestyle. What is excellent treatment for one person, may not be for you. HOME CARE INSTRUCTIONS   Do not smoke.  Follow the lifestyle changes outlined in the "Prevention" section.  If you are on medications, follow the directions carefully. Blood pressure medications must be taken as prescribed. Skipping doses reduces their benefit. It also puts you at risk for problems.  Follow up with your caregiver, as directed.  If you are asked to monitor your blood pressure at home, follow the guidelines in the "Diagnosis" section above. SEEK  MEDICAL CARE IF:   You think you are having medication side effects.  You have recurrent headaches or lightheadedness.  You have swelling in your ankles.  You have trouble with your vision. SEEK IMMEDIATE MEDICAL CARE IF:   You have sudden onset of chest pain or pressure, difficulty breathing, or other symptoms of a heart attack.  You have a severe headache.  You have symptoms of a stroke (such as sudden weakness, difficulty speaking, difficulty walking). MAKE SURE YOU:   Understand these instructions.  Will watch your condition.  Will get help right away if you are not doing well or get worse. Document Released: 05/16/2005 Document Revised: 08/08/2011 Document Reviewed: 12/14/2006 [facility] Patient Information 2014 [place].

## 2013-11-08 NOTE — ED Notes (Addendum)
Per patient/family, has been trying to get BP under control for weeks-placed on Losartan, but they don't feel it is working-was told to go to ED for eval-asymptomatic

## 2013-11-08 NOTE — Progress Notes (Signed)
[facility]    Provider:  Dr Janann Colonel Referring Provider: Modena Jansky, MD Primary Care Physician:  Horatio Pel, MD  CC:  seizure  HPI:  Ronald [name] is a 68 y.o. male here as a referral from Dr. Algis Liming for   Ronald [name] is a 68 y.o. male with a history of stroke who presents with recurrent episodes of altered mental status over the course of an hour. His friend describes that he felt nauseous, then become unresponsive for brief periods of time. He had right head turning with right eye deviation as well as right arm automatisms. He would then suddenly come back to being able to interact. These are all fairly brief, but he had recurrent episodes repeatedly over the course of an hour without ever come back completely to his normal self.Admitted to the hospital, had an unremarkable EEG and brain MRI (showing old infarct but no acute process).   Since discharge he has been stable. No further episodes. He is currently taking Keppra 500mg  twice a day. Continues to have elevated blood pressure. They are working with his PCP to treat this.   Review of Systems: Out of a complete 14 system review, the patient complains of only the following symptoms, and all other reviewed systems are negative. + confusion  History   Social History  . Marital Status: Divorced    Spouse Name: N/A    Number of Children: 2  . Years of Education: hs   Occupational History  . Retired    Social History Main Topics  . Smoking status: Never Smoker   . Smokeless tobacco: Never Used  . Alcohol Use: No  . Drug Use: No  . Sexual Activity: Not on file   Other Topics Concern  . Not on file   Social History Narrative  . No narrative on file    No family history on file.  Past Medical History  Diagnosis Date  . Stroke   . Hypertension     Past Surgical History  Procedure Laterality Date  . Appendectomy      Current Outpatient Prescriptions  Medication  Sig Dispense Refill  . BOOSTRIX 5-2.5-18.5 injection Inject 1 mL into the muscle once as needed.      . levETIRAcetam (KEPPRA) 500 MG tablet Take 1 tablet (500 mg total) by mouth 2 (two) times daily.  60 tablet  0  . losartan (COZAAR) 100 MG tablet Take 100 mg by mouth daily.      . metoprolol tartrate (LOPRESSOR) 25 MG tablet Take 1 tablet (25 mg total) by mouth 2 (two) times daily.  60 tablet  0  . naproxen sodium (ANAPROX) 220 MG tablet Take 220 mg by mouth daily as needed (headache).       No current facility-administered medications for this visit.    Allergies as of 11/08/2013  . (No Known Allergies)    Vitals: BP 201/92  Pulse 73  Ht 5\' 5"  (1.651 m)  Wt 155 lb (70.308 kg)  BMI 25.79 kg/m2 Manual recheck 189/98 Last Weight:  Wt Readings from Last 1 Encounters:  11/08/13 155 lb (70.308 kg)   Last Height:   Ht Readings from Last 1 Encounters:  11/08/13 5\' 5"  (1.651 m)     Physical exam: Alert, oriented, thought content appropriate. Speech fluent without evidence of aphasia.  Cranial Nerves:  II: Vpupils equal, round, reactive to light and accommodation  III,IV, VI: ptosis not present, extra-ocular motions intact bilaterally  V,VII: smile symmetric,  facial light touch sensation normal bilaterally  VIII: hearing normal bilaterally  XII: midline tongue extension without atrophy or fasciculations  Motor:  Right : Upper extremity 5/5 Left: Upper extremity 5/5  Lower extremity 5/5 Lower extremity 5/5  Tone and bulk:normal tone throughout; no atrophy noted  Sensory: light touch intact throughout, bilaterally  Plantars:  Right: downgoing Left: downgoing  Cerebellar:  normal finger-to-nose, normal heel-to-shin test  Assessment:  After physical and neurologic examination, review of laboratory studies, imaging, neurophysiology testing and pre-existing records, assessment will be reviewed on the problem list.  Plan:  Treatment plan and additional workup will be reviewed  under Problem List.  1)Seizure 2)CVA 3)HTN  67y/o gentleman with history of prior CVA presenting for post hospital follow up after recent admission for new onset seizure. Started on keppra 500mg  twice a day, tolerating well with no further episodes. Will continue on keppra for now. He remains on ASA 81mg  daily for secondary stroke prevention. BP elevated >654 systolic rechecked at 650. No indication of new CVA. Will refer patient to ED for further treatment. Long term he will need to follow up with his PCP for risk factor modification. Follow up in 6 months.    Jim Like, DO  [facility] [address] [place], [place] [address]  Phone [phone] Fax [phone]

## 2013-11-08 NOTE — ED Provider Notes (Signed)
CSN: [id number]     Arrival date & time 11/08/13  1404 History   First MD Initiated Contact with Patient 11/08/13 1506     Chief Complaint  Patient presents with  . Hypertension     (Consider location/radiation/quality/duration/timing/severity/associated sxs/prior Treatment) HPI Ronald [name] is a 68 y.o. male with history of CVA, hypertension, and recent admission 1 month ago for new onset of possible seizures, presents to ED with complaint of elevated BP. Pt has had elevated blood pressure for several weeks. Has been seen by PCP, added losartan to already taking lopressor. Today, pt had a follow up apt with neurologist, was found to have systolic BP of 379 and 024, sent to ER for further treatment. Pt has not complains. Per daughter, they have been recording BPs at home, they are all elevated between 097D-532D systolic. PT states currently he is taking lopressor 25mg  twice a day and losartan 100mg  every evening. Pt specifically denies headache, sob, CP, extreme ties swelling, malaise.   Past Medical History  Diagnosis Date  . Stroke   . Hypertension    Past Surgical History  Procedure Laterality Date  . Appendectomy     No family history on file. History  Substance Use Topics  . Smoking status: Never Smoker   . Smokeless tobacco: Never Used  . Alcohol Use: No    Review of Systems  Constitutional: Negative for fever and chills.  Respiratory: Negative for cough, chest tightness and shortness of breath.   Cardiovascular: Negative for chest pain, palpitations and leg swelling.  Gastrointestinal: Negative for nausea, vomiting, abdominal pain, diarrhea and abdominal distention.  Genitourinary: Negative for dysuria, urgency, frequency and hematuria.  Musculoskeletal: Negative for arthralgias, myalgias, neck pain and neck stiffness.  Skin: Negative for rash.  Allergic/Immunologic: Negative for immunocompromised state.  Neurological: Negative for dizziness, weakness,  light-headedness, numbness and headaches.      Allergies  Review of patient's allergies indicates no known allergies.  Home Medications   Prior to Admission medications   Medication Sig Start Date End Date Taking? Authorizing Provider  aspirin EC 81 MG tablet Take 1 tablet (81 mg total) by mouth daily. 11/08/13   Hulen Luster, DO  BOOSTRIX 5-2.5-18.5 injection Inject 1 mL into the muscle once as needed. 10/17/13   Historical Provider, MD  levETIRAcetam (KEPPRA) 500 MG tablet Take 1 tablet (500 mg total) by mouth 2 (two) times daily. 09/30/13   Modena Jansky, MD  losartan (COZAAR) 100 MG tablet Take 200 mg by mouth daily.  10/31/13   Historical Provider, MD  metoprolol tartrate (LOPRESSOR) 25 MG tablet Take 1 tablet (25 mg total) by mouth 2 (two) times daily. 09/30/13   Modena Jansky, MD  naproxen sodium (ANAPROX) 220 MG tablet Take 220 mg by mouth daily as needed (headache).    Historical Provider, MD   BP 200/88  Pulse 70  Temp(Src) 98.5 F (36.9 C) (Oral)  Resp 16  SpO2 96% Physical Exam  Nursing note and vitals reviewed. Constitutional: He is oriented to person, place, and time. He appears well-developed and well-nourished. No distress.  HENT:  Head: Normocephalic and atraumatic.  Eyes: Conjunctivae and EOM are normal. Pupils are equal, round, and reactive to light.  Neck: Neck supple.  Cardiovascular: Normal rate, regular rhythm and normal heart sounds.   Pulmonary/Chest: Effort normal. No respiratory distress. He has no wheezes. He has no rales.  Abdominal: Soft. Bowel sounds are normal. He exhibits no distension. There is no tenderness. There is  no rebound.  Musculoskeletal: He exhibits no edema.  Neurological: He is alert and oriented to person, place, and time. No cranial nerve deficit. Coordination normal.  5/5 and equal upper and lower extremity strength bilaterally. Equal grip strength bilaterally. Normal finger to nose and heel to shin. No pronator drift.  Patellar reflexes 2+   Skin: Skin is warm and dry.    ED Course  Procedures (including critical care time) Labs Review Labs Reviewed  BASIC METABOLIC PANEL - Abnormal; Notable for the following:    Glucose, Bld 112 (*)    GFR calc non Af Amer 89 (*)    All other components within normal limits  CBC WITH DIFFERENTIAL  I-STAT TROPOININ, ED    Imaging Review No results found.   EKG Interpretation   Date/Time:  Friday November 08 2013 15:27:13 EDT Ventricular Rate:  55 PR Interval:  195 QRS Duration: 87 QT Interval:  404 QTC Calculation: 386 R Axis:   29 Text Interpretation:  Sinus rhythm Ventricular premature complex Baseline  wander in lead(s) I III aVL No significant change since last tracing  Confirmed by YAO  MD, DAVID ([id number]) on 11/08/2013 3:37:03 PM      MDM   Final diagnoses:  Hypertension   Patient emergency department with elevated blood pressure, here low 200s over 80s. Patient is asymptomatic for his hypertension. He has no neurological deficits. No signs of an acute CVA. No signs of endorgan damage, he denies any chest pain, visual changes, headache, numbness or weakness in extremities. Lab work obtained and is completely normal. EKG unremarkable. Patient received a dose of HCTZ in the ED and his blood pressure is coming down. We will discharge home, continue current medications, add HCTZ 25 mg, patient has an appointment with Dr. Shelia Media in 2 days. Followup with him. Discussed symptoms that should bring him to back to emergency department.  Filed Vitals:   11/08/13 1420 11/08/13 1528 11/08/13 1630 11/08/13 1700  BP: 200/88 207/89 187/79 177/80  Pulse:  54 53 51  Temp:      TempSrc:      Resp:  20 18 16   SpO2:  99% 97% 99%      Mio Schellinger A Coady Train, PA-C 11/08/13 1725

## 2014-02-03 ENCOUNTER — Other Ambulatory Visit (HOSPITAL_COMMUNITY): Payer: Self-pay | Admitting: Internal Medicine

## 2014-04-04 ENCOUNTER — Telehealth: Payer: Self-pay | Admitting: Neurology

## 2014-05-09 ENCOUNTER — Ambulatory Visit: Payer: Medicare Other | Admitting: Neurology

## 2014-12-30 ENCOUNTER — Institutional Professional Consult (permissible substitution): Payer: Medicare Other | Admitting: Diagnostic Neuroimaging

## 2014-12-31 ENCOUNTER — Encounter: Payer: Self-pay | Admitting: Diagnostic Neuroimaging

## 2015-02-24 ENCOUNTER — Encounter: Payer: Self-pay | Admitting: Internal Medicine

## 2015-07-08 ENCOUNTER — Encounter: Payer: Self-pay | Admitting: Gastroenterology

## 2015-08-27 ENCOUNTER — Ambulatory Visit (INDEPENDENT_AMBULATORY_CARE_PROVIDER_SITE_OTHER): Payer: Medicare Other | Admitting: Gastroenterology

## 2015-08-27 ENCOUNTER — Encounter: Payer: Self-pay | Admitting: Gastroenterology

## 2015-08-27 VITALS — BP 130/70 | HR 74 | Ht 63.0 in | Wt 150.0 lb

## 2015-08-27 DIAGNOSIS — Z86010 Personal history of colonic polyps: Secondary | ICD-10-CM | POA: Diagnosis not present

## 2015-08-27 NOTE — Patient Instructions (Signed)
You have been scheduled for a colonoscopy. Please follow written instructions given to you at your visit today.  Please pick up your prep supplies at the pharmacy within the next 1-3 days. If you use inhalers (even only as needed), please bring them with you on the day of your procedure. Your physician has requested that you go to www.startemmi.com and enter the access code given to you at your visit today. This web site gives a general overview about your procedure. However, you should still follow specific instructions given to you by our office regarding your preparation for the procedure.  If you are age 1 or older, your body mass index should be between 23-30. Your Body mass index is 26.58 kg/(m^2). If this is out of the aforementioned range listed, please consider follow up with your Primary Care Provider.  If you are age 57 or younger, your body mass index should be between 19-25. Your Body mass index is 26.58 kg/(m^2). If this is out of the aformentioned range listed, please consider follow up with your Primary Care Provider.   Thank you for choosing  GI  Dr Wilfrid Lund III

## 2015-08-27 NOTE — Progress Notes (Signed)
[name] Consult Note:  History: Ronald [name] 08/27/2015  Referring physician: Horatio Pel, MD  Reason for consult/chief complaint: Colon Cancer Screening   Subjective HPI:  This patient is here to discuss a surveillance colonoscopy. He denies abdominal pain altered bowel habits or rectal bleeding. Colonoscopy report by Dr. Lajoyce Corners in 2003 shows 2 sigmoid tubular adenomas removed. Another report from May 2006 shows removal of a cecal tubular adenoma. The patient seems to describe a negative stool card for occult blood recently, but no reports are available.   ROS:  Review of Systems He denies chest pain or dyspnea  Past Medical History: Past Medical History  Diagnosis Date  . Stroke ([place])   . Hypertension      Past Surgical History: Past Surgical History  Procedure Laterality Date  . Appendectomy       Family History: Family History  Problem Relation Age of Onset  . Prostate cancer Brother   . Colon cancer Neg Hx     Social History: Social History   Social History  . Marital Status: Divorced    Spouse Name: N/A  . Number of Children: 2  . Years of Education: hs   Occupational History  . Retired    Social History Main Topics  . Smoking status: Never Smoker   . Smokeless tobacco: Never Used  . Alcohol Use: No  . Drug Use: No  . Sexual Activity: Not Asked   Other Topics Concern  . None   Social History Narrative    Allergies: No Known Allergies  Outpatient Meds: Current Outpatient Prescriptions  Medication Sig Dispense Refill  . aspirin EC 81 MG tablet Take 1 tablet (81 mg total) by mouth daily.    Marland Kitchen levETIRAcetam (KEPPRA) 500 MG tablet Take 1 tablet (500 mg total) by mouth 2 (two) times daily. 60 tablet 0  . losartan (COZAAR) 100 MG tablet Take 100 mg by mouth daily.     . metoprolol tartrate (LOPRESSOR) 25 MG tablet Take 1 tablet (25 mg total) by mouth 2 (two) times daily. 60 tablet 0   No current  facility-administered medications for this visit.      ___________________________________________________________________ Objective  Exam:  BP 130/70 mmHg  Pulse 74  Ht 5\' 3"  (1.6 m)  Wt 150 lb (68.04 kg)  BMI 26.58 kg/m2   General: this is a(n) Well-appearing man with good muscle mass   Eyes: sclera anicteric, no redness  ENT: oral mucosa moist without lesions, no cervical or supraclavicular lymphadenopathy, good dentition  CV: RRR without murmur, S1/S2, no JVD, no peripheral edema  Resp: clear to auscultation bilaterally, normal RR and effort noted  GI: soft, no tenderness, with active bowel sounds. No guarding or palpable organomegaly noted.  Skin; warm and dry, no rash or jaundice noted  Neuro: awake, alert and oriented x 3. Normal gross motor function and fluent speech    Assessment: Encounter Diagnosis  Name Primary?  . History of colonic polyps Yes      Plan:  Surveillance Colonoscopy  The benefits and risks of the planned procedure were described in detail with the patient or (when appropriate) their health care proxy.  Risks were outlined as including, but not limited to, bleeding, infection, perforation, adverse medication reaction leading to cardiac or pulmonary decompensation, or pancreatitis (if ERCP).  The limitation of incomplete mucosal visualization was also discussed.  No guarantees or warranties were given.   Thank you for the courtesy of this consult.  Please call me with any  questions or concerns.  Nelida Meuse III

## 2015-09-16 ENCOUNTER — Ambulatory Visit (AMBULATORY_SURGERY_CENTER): Payer: Medicare Other | Admitting: Gastroenterology

## 2015-09-16 ENCOUNTER — Encounter: Payer: Self-pay | Admitting: Gastroenterology

## 2015-09-16 VITALS — BP 114/69 | HR 52 | Temp 98.2°F | Resp 11 | Ht 63.0 in | Wt 150.0 lb

## 2015-09-16 DIAGNOSIS — Z86010 Personal history of colonic polyps: Secondary | ICD-10-CM | POA: Diagnosis not present

## 2015-09-16 DIAGNOSIS — D128 Benign neoplasm of rectum: Secondary | ICD-10-CM | POA: Diagnosis not present

## 2015-09-16 DIAGNOSIS — D120 Benign neoplasm of cecum: Secondary | ICD-10-CM | POA: Diagnosis not present

## 2015-09-16 HISTORY — PX: COLONOSCOPY: SHX174

## 2015-09-16 MED ORDER — SODIUM CHLORIDE 0.9 % IV SOLN: 500.0000 mL | INTRAVENOUS | Status: DC

## 2015-09-16 NOTE — Patient Instructions (Signed)
YOU HAD AN ENDOSCOPIC PROCEDURE TODAY AT [facility]:   Refer to the procedure report that was given to you for any specific questions about what was found during the examination.  If the procedure report does not answer your questions, please call your gastroenterologist to clarify.  If you requested that your care partner not be given the details of your procedure findings, then the procedure report has been included in a sealed envelope for you to review at your convenience later.  YOU SHOULD EXPECT: Some feelings of bloating in the abdomen. Passage of more gas than usual.  Walking can help get rid of the air that was put into your GI tract during the procedure and reduce the bloating. If you had a lower endoscopy (such as a colonoscopy or flexible sigmoidoscopy) you may notice spotting of blood in your stool or on the toilet paper. If you underwent a bowel prep for your procedure, you may not have a normal bowel movement for a few days.  Please Note:  You might notice some irritation and congestion in your nose or some drainage.  This is from the oxygen used during your procedure.  There is no need for concern and it should clear up in a day or so.  SYMPTOMS TO REPORT IMMEDIATELY:   Following lower endoscopy (colonoscopy or flexible sigmoidoscopy):  Excessive amounts of blood in the stool  Significant tenderness or worsening of abdominal pains  Swelling of the abdomen that is new, acute  Fever of 100F or higher  For urgent or emergent issues, a gastroenterologist can be reached at any hour by calling [phone].   DIET: Your first meal following the procedure should be a small meal and then it is ok to progress to your normal diet. Heavy or fried foods are harder to digest and may make you feel nauseous or bloated.  Likewise, meals heavy in dairy and vegetables can increase bloating.  Drink plenty of fluids but you should avoid alcoholic beverages for 24  hours.  ACTIVITY:  You should plan to take it easy for the rest of today and you should NOT DRIVE or use heavy machinery until tomorrow (because of the sedation medicines used during the test).    FOLLOW UP: Our staff will call the number listed on your records the next business day following your procedure to check on you and address any questions or concerns that you may have regarding the information given to you following your procedure. If we do not reach you, we will leave a message.  However, if you are feeling well and you are not experiencing any problems, there is no need to return our call.  We will assume that you have returned to your regular daily activities without incident.  If any biopsies were taken you will be contacted by phone or by letter within the next 1-3 weeks.  Please call us at [phone] if you have not heard about the biopsies in 3 weeks.    SIGNATURES/CONFIDENTIALITY: You and/or your care partner have signed paperwork which will be entered into your electronic medical record.  These signatures attest to the fact that that the information above on your After Visit Summary has been reviewed and is understood.  Full responsibility of the confidentiality of this discharge information lies with you and/or your care-partner.    Handouts were given to your care partner on polyps, diverticulosis, and a high fiber diet with liberal fluid intake Please hold aspirin, ibuprofen,  naproxen, or NSAIDs for 5 days. You may resume your other current medications today. Await biopsy results. Please call if any questions or concerns.

## 2015-09-16 NOTE — Op Note (Signed)
[facility] Patient Name: Ronald [name] Procedure Date: 09/16/2015 3:03 PM MRN: [id number] Endoscopist: Mallie Mussel L. Loletha Carrow , MD Age: 70 Date of Birth: [birth year] Gender: Male Procedure:                Colonoscopy Indications:              Surveillance: Personal history of adenomatous                            polyps on last colonoscopy > 5 years ago Medicines:                Monitored Anesthesia Care Procedure:                Pre-Anesthesia Assessment:                           - Prior to the procedure, a History and Physical                            was performed, and patient medications and                            allergies were reviewed. The patient's tolerance of                            previous anesthesia was also reviewed. The risks                            and benefits of the procedure and the sedation                            options and risks were discussed with the patient.                            All questions were answered, and informed consent                            was obtained. Prior Anticoagulants: The patient has                            taken aspirin, last dose was 1 day prior to                            procedure. ASA Grade Assessment: II - A patient                            with mild systemic disease. After reviewing the                            risks and benefits, the patient was deemed in                            satisfactory condition to undergo the procedure.  After obtaining informed consent, the colonoscope                            was passed under direct vision. Throughout the                            procedure, the patient's blood pressure, pulse, and                            oxygen saturations were monitored continuously. The                            Model CF-HQ190L [id number]) scope was introduced                            through the anus and advanced to the the cecum,            identified by appendiceal orifice and ileocecal                            valve. The colonoscopy was performed without                            difficulty. The patient tolerated the procedure                            well. The quality of the bowel preparation was                            fair. The ileocecal valve, appendiceal orifice, and                            rectum were photographed. The quality of the bowel                            preparation was evaluated using the BBPS [facility]                            Bowel Preparation Scale) with scores of: Right                            Colon = 1, Transverse Colon = 2 and Left Colon = 1.                            The total BBPS score equals 4. The bowel                            preparation used was Miralax. Scope In: 3:17:56 PM Scope Out: 3:41:02 PM Scope Withdrawal Time: 0 hours 18 minutes 50 seconds  Total Procedure Duration: 0 hours 23 minutes 6 seconds  Findings:                 The perianal and digital rectal examinations were  normal.                           Multiple small-mouthed diverticula were found in                            the sigmoid colon.                           A 6 mm polyp was found in the rectum. The polyp was                            sessile. The polyp was removed with a cold snare.                            Resection and retrieval were complete.                           Two sessile polyps were found in the cecum. The                            polyps were 6 to 8 mm in size. One of these polyps                            was removed with a cold snare, and the other was                            removed with a hot snare. Resection and retrieval                            were complete.                           The exam was otherwise without abnormality on                            direct and retroflexion views. Complications:            No immediate  complications. Estimated Blood Loss:     Estimated blood loss was minimal. Impression:               - Preparation of the colon was fair.                           - Diverticulosis in the sigmoid colon.                           - One 6 mm polyp in the rectum, removed with a cold                            snare. Resected and retrieved.                           - Two 4 to 6 mm polyps in the cecum, removed with a  cold snare. Resected and retrieved.                           - The examination was otherwise normal on direct                            and retroflexion views. Recommendation:           - Patient has a contact number available for                            emergencies. The signs and symptoms of potential                            delayed complications were discussed with the                            patient. Return to normal activities tomorrow.                            Written discharge instructions were provided to the                            patient.                           - Resume previous diet.                           - Continue present medications.                           - No aspirin, ibuprofen, naproxen, or other                            non-steroidal anti-inflammatory drugs for 5 days                            after polyp removal.                           - Repeat colonoscopy is recommended for                            surveillance. The colonoscopy date will be                            determined after pathology results from today's                            exam become available for review. Copelan Maultsby L. Loletha Carrow, MD 09/16/2015 3:46:41 PM This report has been signed electronically.

## 2015-09-16 NOTE — Progress Notes (Signed)
Called to room to assist during endoscopic procedure.  Patient ID and intended procedure confirmed with present staff. Received instructions for my participation in the procedure from the performing physician.  

## 2015-09-16 NOTE — Progress Notes (Signed)
No problems noted in the recovery room. maw 

## 2015-09-16 NOTE — Progress Notes (Signed)
Patient denies any allergies to eggs or soy. 

## 2015-09-16 NOTE — Progress Notes (Signed)
Report to PACU, RN, vss, BBS= Clear.  

## 2015-09-17 ENCOUNTER — Telehealth: Payer: Self-pay | Admitting: *Deleted

## 2015-09-17 NOTE — Telephone Encounter (Signed)
  Follow up Call-  Call back number 09/16/2015  Post procedure Call Back phone  # [phone] home  Permission to leave phone message Yes     Patient questions:  Do you have a fever, pain , or abdominal swelling? No. Pain Score  0 *  Have you tolerated food without any problems? Yes.    Have you been able to return to your normal activities? Yes.    Do you have any questions about your discharge instructions: Diet   No. Medications  No. Follow up visit  No.  Do you have questions or concerns about your Care? No.  Actions: * If pain score is 4 or above: No action needed, pain <4.

## 2015-09-21 ENCOUNTER — Encounter: Payer: Self-pay | Admitting: Gastroenterology

## 2016-02-17 IMAGING — CT CT HEAD W/O CM
1 series · 16 of 30 positions shown, 20 images · non-contrast
Comparison: None.

CLINICAL DATA: Altered mental status

EXAM:
CT HEAD WITHOUT CONTRAST
TECHNIQUE: Contiguous axial images were obtained from the base of the skull
through the vertex without intravenous contrast.

[Series 2: head 5.0 h30s · axial · 0.40mm/px · z∈[+1434,+1574]mm · 16 of 32 slices shown, 20 images]
[im 2/32  brain]
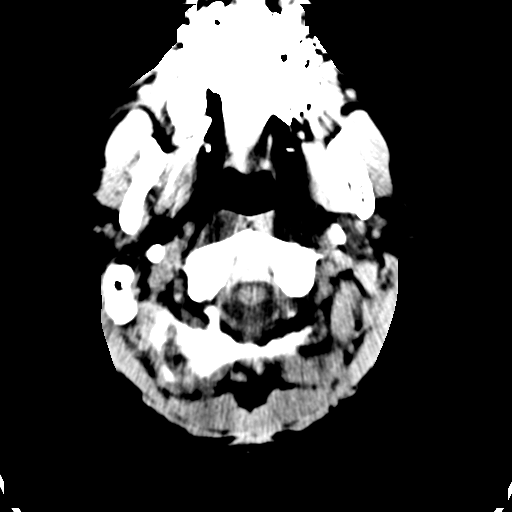
[im 2/32  bone]
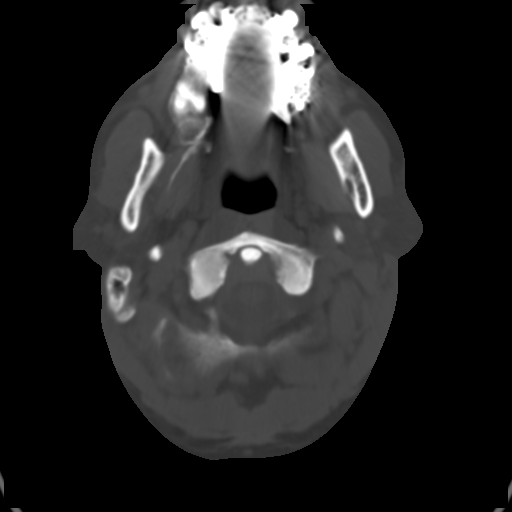
[im 4/32  brain]
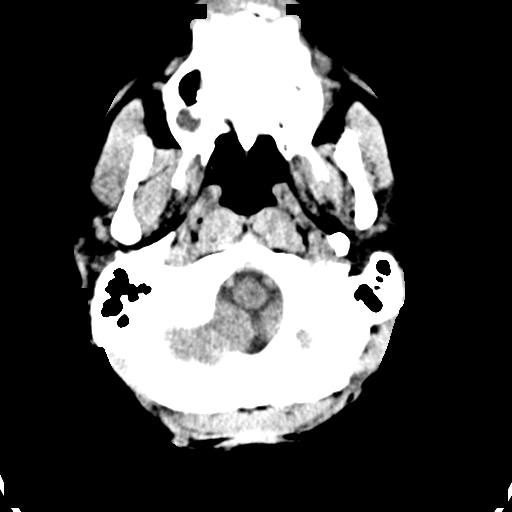
[im 6/32  brain]
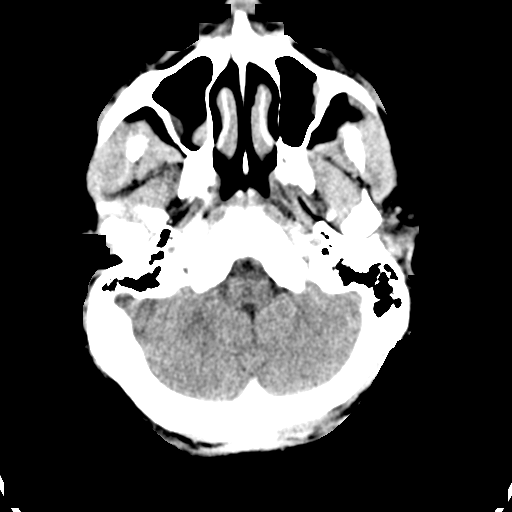
[im 8/32  brain]
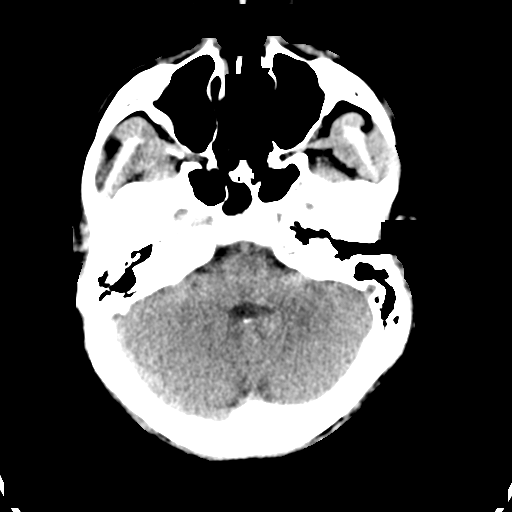
[im 9/32  brain]
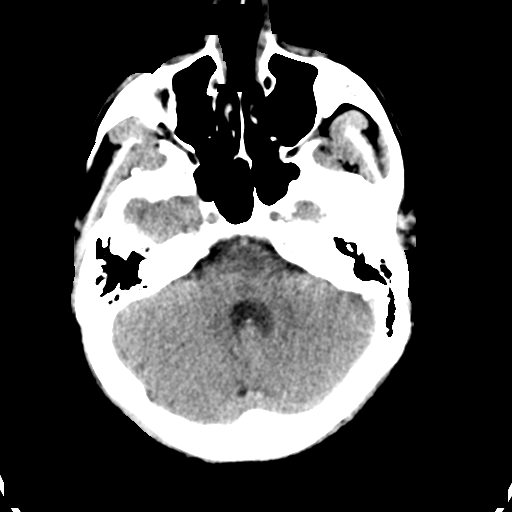
[im 9/32  bone]
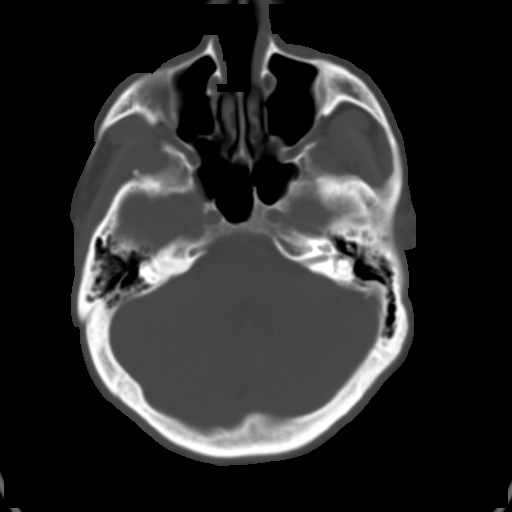
[im 11/32  brain]
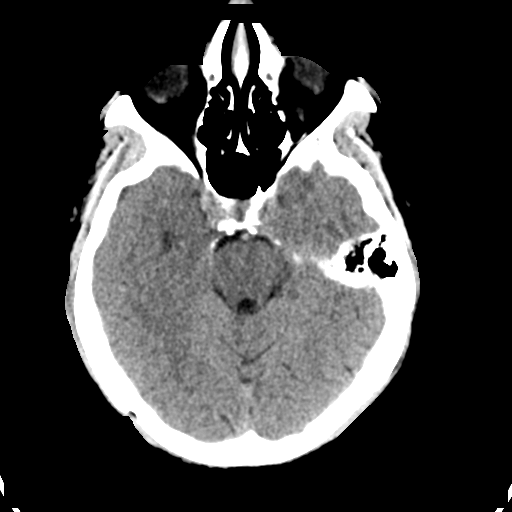
[im 13/32  brain]
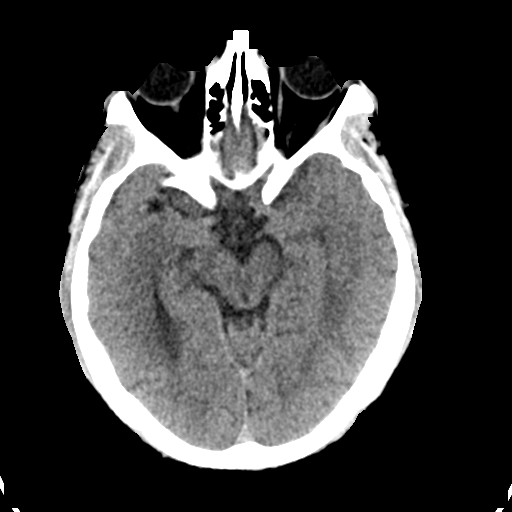
[im 15/32  brain]
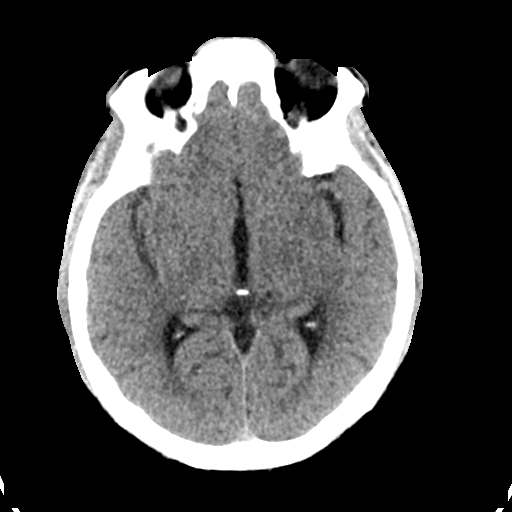
[im 17/32  brain]
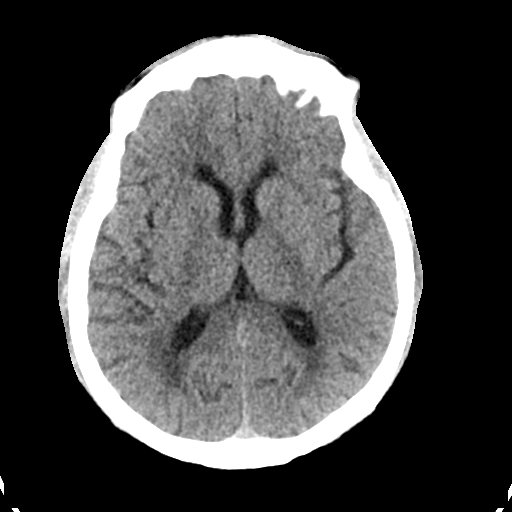
[im 17/32  bone]
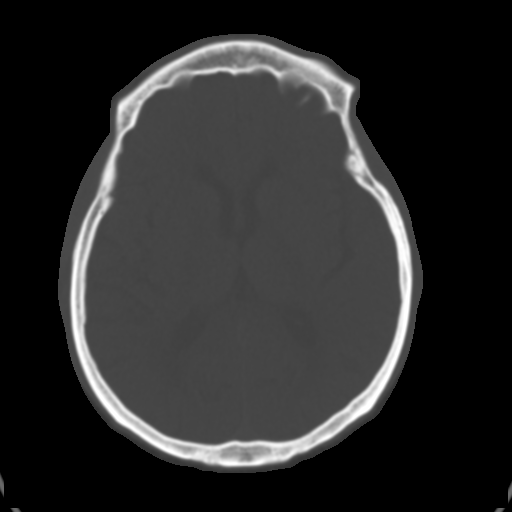
[im 19/32  brain]
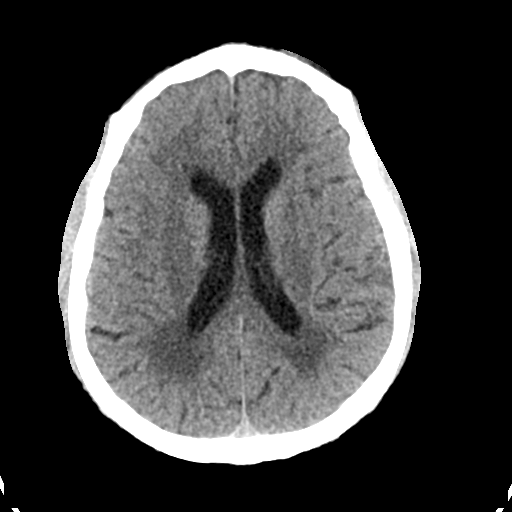
[im 21/32  brain]
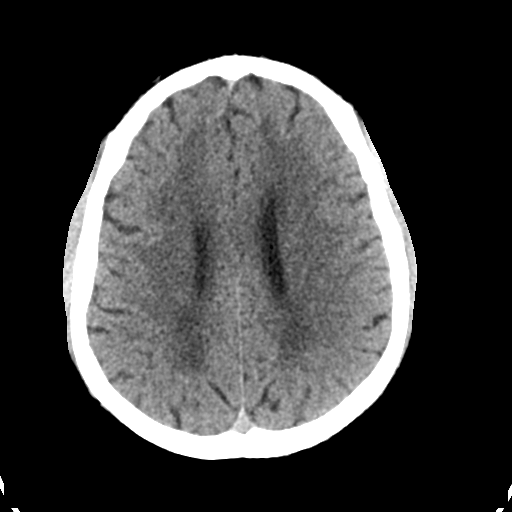
[im 23/32  brain]
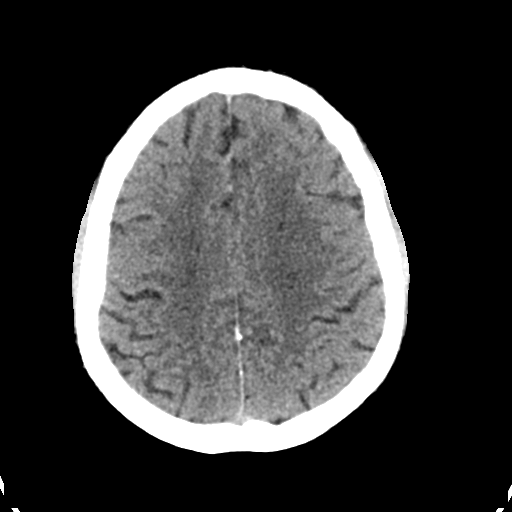
[im 24/32  brain]
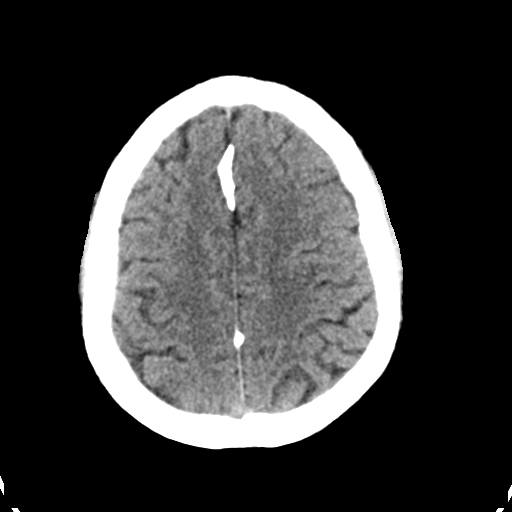
[im 24/32  bone]
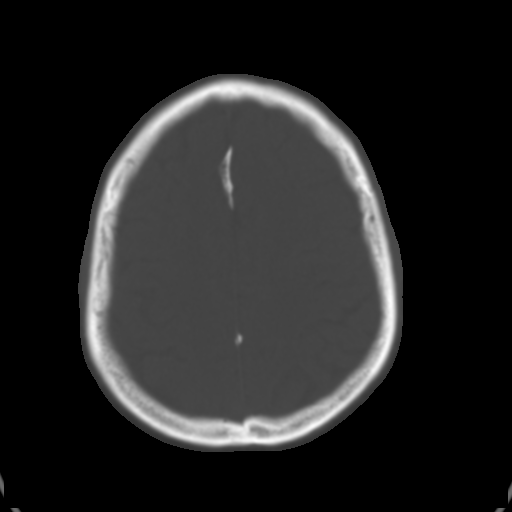
[im 26/32  brain]
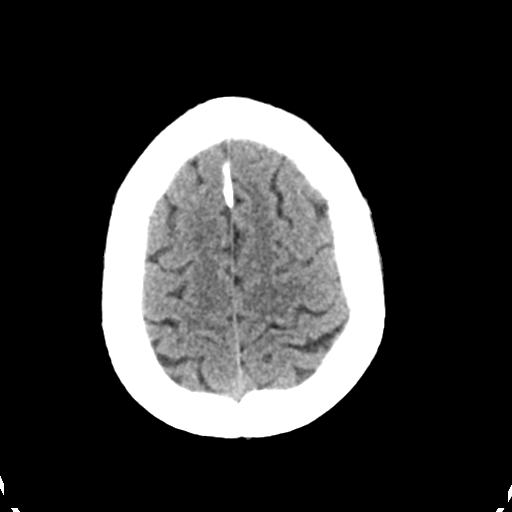
[im 28/32  brain]
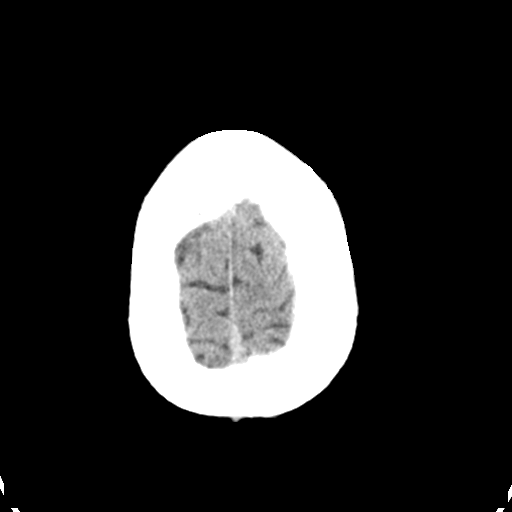
[im 30/32  brain]
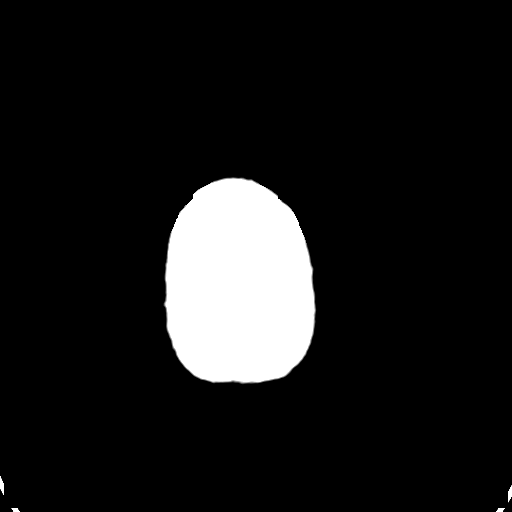

[16 of 30 positions shown; findings below may reference images not displayed]

FINDINGS: No evidence of parenchymal hemorrhage or extra-axial fluid
collection. No mass lesion, mass effect, or midline shift.

No CT evidence of acute infarction.

Subcortical white matter and periventricular small vessel ischemic
changes.

Cerebral volume is within normal limits.  No ventriculomegaly.

Partial opacification of the bilateral maxillary sinusitis. Mastoid
air cells are clear.

No evidence of calvarial fracture.
IMPRESSION: No evidence of acute intracranial abnormality.

Small vessel ischemic changes.

## 2018-03-19 ENCOUNTER — Encounter

## 2018-03-19 ENCOUNTER — Ambulatory Visit: Payer: Medicare Other | Admitting: Neurology

## 2018-03-20 ENCOUNTER — Encounter: Payer: Self-pay | Admitting: Neurology

## 2018-10-05 ENCOUNTER — Encounter: Payer: Self-pay | Admitting: Gastroenterology

## 2019-07-26 DIAGNOSIS — I10 Essential (primary) hypertension: Secondary | ICD-10-CM | POA: Diagnosis not present

## 2019-07-26 DIAGNOSIS — Z2883 Immunization not carried out due to unavailability of vaccine: Secondary | ICD-10-CM | POA: Diagnosis not present

## 2019-08-01 ENCOUNTER — Ambulatory Visit: Payer: Medicare PPO | Attending: Internal Medicine

## 2019-08-01 DIAGNOSIS — Z23 Encounter for immunization: Secondary | ICD-10-CM

## 2019-08-01 NOTE — Progress Notes (Signed)
   Covid-19 Vaccination Clinic  Name:  Ronald [name]    MRN: [id number] DOB: Dec 12, 1945  08/01/2019  [name] was observed post Covid-19 immunization for 15 minutes without incident. He was provided with Vaccine Information Sheet and instruction to access the V-Safe system.   [name] was instructed to call 911 with any severe reactions post vaccine: Marland Kitchen Difficulty breathing  . Swelling of face and throat  . A fast heartbeat  . A bad rash all over body  . Dizziness and weakness   Immunizations Administered    Name Date Dose VIS Date Route   Pfizer COVID-19 Vaccine 08/01/2019  1:44 PM 0.3 mL 05/10/2019 Intramuscular   Manufacturer: [employer]   Lot: [id number]   [place]: [id number]

## 2019-08-28 ENCOUNTER — Ambulatory Visit: Payer: Medicare PPO | Attending: Internal Medicine

## 2019-08-28 DIAGNOSIS — Z23 Encounter for immunization: Secondary | ICD-10-CM

## 2019-08-28 NOTE — Progress Notes (Signed)
   Covid-19 Vaccination Clinic  Name:  Ronald [name]    MRN: [id number] DOB: [birth year]  08/28/2019  [name] was observed post Covid-19 immunization for 15 minutes without incident. He was provided with Vaccine Information Sheet and instruction to access the V-Safe system.   [name] was instructed to call 911 with any severe reactions post vaccine: Marland Kitchen Difficulty breathing  . Swelling of face and throat  . A fast heartbeat  . A bad rash all over body  . Dizziness and weakness   Immunizations Administered    Name Date Dose VIS Date Route   Pfizer COVID-19 Vaccine 08/28/2019  1:28 PM 0.3 mL 05/10/2019 Intramuscular   Manufacturer: [place]   Lot: [id number]   [place]: [id number]

## 2020-01-28 DIAGNOSIS — Z0000 Encounter for general adult medical examination without abnormal findings: Secondary | ICD-10-CM | POA: Diagnosis not present

## 2020-01-28 DIAGNOSIS — Z125 Encounter for screening for malignant neoplasm of prostate: Secondary | ICD-10-CM | POA: Diagnosis not present

## 2020-01-28 DIAGNOSIS — E7800 Pure hypercholesterolemia, unspecified: Secondary | ICD-10-CM | POA: Diagnosis not present

## 2020-01-28 DIAGNOSIS — I10 Essential (primary) hypertension: Secondary | ICD-10-CM | POA: Diagnosis not present

## 2020-12-09 DIAGNOSIS — E7800 Pure hypercholesterolemia, unspecified: Secondary | ICD-10-CM | POA: Diagnosis not present

## 2020-12-09 DIAGNOSIS — Z125 Encounter for screening for malignant neoplasm of prostate: Secondary | ICD-10-CM | POA: Diagnosis not present

## 2020-12-09 DIAGNOSIS — I10 Essential (primary) hypertension: Secondary | ICD-10-CM | POA: Diagnosis not present

## 2020-12-23 DIAGNOSIS — G40209 Localization-related (focal) (partial) symptomatic epilepsy and epileptic syndromes with complex partial seizures, not intractable, without status epilepticus: Secondary | ICD-10-CM | POA: Diagnosis not present

## 2020-12-23 DIAGNOSIS — I10 Essential (primary) hypertension: Secondary | ICD-10-CM | POA: Diagnosis not present

## 2020-12-23 DIAGNOSIS — R7309 Other abnormal glucose: Secondary | ICD-10-CM | POA: Diagnosis not present

## 2020-12-23 DIAGNOSIS — Z1211 Encounter for screening for malignant neoplasm of colon: Secondary | ICD-10-CM | POA: Diagnosis not present

## 2020-12-23 DIAGNOSIS — Z8673 Personal history of transient ischemic attack (TIA), and cerebral infarction without residual deficits: Secondary | ICD-10-CM | POA: Diagnosis not present

## 2020-12-25 ENCOUNTER — Other Ambulatory Visit: Payer: Self-pay | Admitting: Internal Medicine

## 2020-12-25 DIAGNOSIS — I10 Essential (primary) hypertension: Secondary | ICD-10-CM

## 2021-02-04 ENCOUNTER — Ambulatory Visit
Admission: RE | Admit: 2021-02-04 | Discharge: 2021-02-04 | Disposition: A | Discharge status: Home / Self Care | Payer: No Typology Code available for payment source | Source: Ambulatory Visit | Attending: Internal Medicine | Admitting: Internal Medicine

## 2021-02-04 DIAGNOSIS — I10 Essential (primary) hypertension: Secondary | ICD-10-CM

## 2021-04-08 DIAGNOSIS — Z0000 Encounter for general adult medical examination without abnormal findings: Secondary | ICD-10-CM | POA: Diagnosis not present

## 2021-04-08 DIAGNOSIS — Z86010 Personal history of colonic polyps: Secondary | ICD-10-CM | POA: Diagnosis not present

## 2021-04-08 DIAGNOSIS — E7800 Pure hypercholesterolemia, unspecified: Secondary | ICD-10-CM | POA: Diagnosis not present

## 2021-04-08 DIAGNOSIS — R7309 Other abnormal glucose: Secondary | ICD-10-CM | POA: Diagnosis not present

## 2021-04-08 DIAGNOSIS — G40209 Localization-related (focal) (partial) symptomatic epilepsy and epileptic syndromes with complex partial seizures, not intractable, without status epilepticus: Secondary | ICD-10-CM | POA: Diagnosis not present

## 2021-04-08 DIAGNOSIS — Z8673 Personal history of transient ischemic attack (TIA), and cerebral infarction without residual deficits: Secondary | ICD-10-CM | POA: Diagnosis not present

## 2021-04-08 DIAGNOSIS — N529 Male erectile dysfunction, unspecified: Secondary | ICD-10-CM | POA: Diagnosis not present

## 2021-04-08 DIAGNOSIS — I10 Essential (primary) hypertension: Secondary | ICD-10-CM | POA: Diagnosis not present

## 2021-04-08 DIAGNOSIS — K7689 Other specified diseases of liver: Secondary | ICD-10-CM | POA: Diagnosis not present

## 2021-04-08 DIAGNOSIS — Z1159 Encounter for screening for other viral diseases: Secondary | ICD-10-CM | POA: Diagnosis not present

## 2023-07-24 DIAGNOSIS — Z125 Encounter for screening for malignant neoplasm of prostate: Secondary | ICD-10-CM | POA: Diagnosis not present

## 2023-07-24 DIAGNOSIS — I10 Essential (primary) hypertension: Secondary | ICD-10-CM | POA: Diagnosis not present

## 2023-07-24 DIAGNOSIS — E7800 Pure hypercholesterolemia, unspecified: Secondary | ICD-10-CM | POA: Diagnosis not present

## 2023-07-24 DIAGNOSIS — R7309 Other abnormal glucose: Secondary | ICD-10-CM | POA: Diagnosis not present

## 2023-07-27 DIAGNOSIS — R413 Other amnesia: Secondary | ICD-10-CM | POA: Diagnosis not present

## 2023-07-27 DIAGNOSIS — N529 Male erectile dysfunction, unspecified: Secondary | ICD-10-CM | POA: Diagnosis not present

## 2023-07-27 DIAGNOSIS — Z8673 Personal history of transient ischemic attack (TIA), and cerebral infarction without residual deficits: Secondary | ICD-10-CM | POA: Diagnosis not present

## 2023-07-27 DIAGNOSIS — Z0000 Encounter for general adult medical examination without abnormal findings: Secondary | ICD-10-CM | POA: Diagnosis not present

## 2023-07-27 DIAGNOSIS — R2689 Other abnormalities of gait and mobility: Secondary | ICD-10-CM | POA: Diagnosis not present

## 2023-07-27 DIAGNOSIS — G40209 Localization-related (focal) (partial) symptomatic epilepsy and epileptic syndromes with complex partial seizures, not intractable, without status epilepticus: Secondary | ICD-10-CM | POA: Diagnosis not present

## 2023-07-27 DIAGNOSIS — I10 Essential (primary) hypertension: Secondary | ICD-10-CM | POA: Diagnosis not present

## 2023-07-27 DIAGNOSIS — R7309 Other abnormal glucose: Secondary | ICD-10-CM | POA: Diagnosis not present

## 2023-07-27 DIAGNOSIS — Z8042 Family history of malignant neoplasm of prostate: Secondary | ICD-10-CM | POA: Diagnosis not present

## 2023-07-27 DIAGNOSIS — Z860101 Personal history of adenomatous and serrated colon polyps: Secondary | ICD-10-CM | POA: Diagnosis not present

## 2023-08-31 ENCOUNTER — Ambulatory Visit (HOSPITAL_COMMUNITY): Admission: EM | Admit: 2023-08-31 | Discharge: 2023-08-31 | Disposition: A | Discharge status: Home / Self Care

## 2023-08-31 ENCOUNTER — Encounter (HOSPITAL_COMMUNITY): Payer: Self-pay | Admitting: Emergency Medicine

## 2023-08-31 DIAGNOSIS — K529 Noninfective gastroenteritis and colitis, unspecified: Secondary | ICD-10-CM | POA: Diagnosis not present

## 2023-08-31 MED ORDER — ONDANSETRON 4 MG PO TBDP
ORAL_TABLET | ORAL | Status: AC
  Filled 2023-08-31: qty 1

## 2023-08-31 MED ORDER — ONDANSETRON HCL 4 MG/2ML IJ SOLN
4.0000 mg | Freq: Once | INTRAMUSCULAR | Status: AC
  Administered 2023-08-31: 4 mg via INTRAMUSCULAR

## 2023-08-31 MED ORDER — ONDANSETRON HCL 4 MG/2ML IJ SOLN
INTRAMUSCULAR | Status: AC
  Filled 2023-08-31: qty 2

## 2023-08-31 MED ORDER — ONDANSETRON 4 MG PO TBDP: 4.0000 mg | ORAL_TABLET | Freq: Three times a day (TID) | ORAL | 0 refills | Status: AC | PRN

## 2023-08-31 NOTE — ED Triage Notes (Signed)
 Pt had n/v/d and abd pain since yesterday. Didn't take anything for his symptoms.

## 2023-08-31 NOTE — ED Provider Notes (Signed)
 [facility]    CSN: [id number] Arrival date & time: 08/31/23  0808      History   Chief Complaint Chief Complaint  Patient presents with   Diarrhea   Emesis    HPI Ronald [name] is a 78 y.o. male who presents  with his daughters due to onset of n/v/d since yesterday. Has not been taking anything for symptoms. He thinks he vomited about 10 times til the middle of the night, but none this am and possibly had 5 watery BM's. He has fish sandwich from fast food restaurant the day before symptoms started. Denies fever or URI symptoms.     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 Authorizing Provider  olmesartan (BENICAR) 20 MG tablet Take 20 mg by mouth daily. 08/27/23  Yes [provider]  ondansetron (ZOFRAN-ODT) 4 MG disintegrating tablet Take 1 tablet (4 mg total) by mouth every 8 (eight) hours as needed for nausea or vomiting. 08/31/23  Yes Rodriguez-Southworth, Nettie Elm, PA-C  sildenafil (VIAGRA) 50 MG tablet Take 50 mg by mouth daily as needed. 07/27/23  Yes [provider]  aspirin EC 81 MG tablet Take 1 tablet (81 mg total) by mouth daily. 11/08/13   Ramond Marrow, DO  levETIRAcetam (KEPPRA) 500 MG tablet Take 1 tablet (500 mg total) by mouth 2 (two) times daily. 09/30/13   Hongalgi, Maximino Greenland, MD  losartan-hydrochlorothiazide (HYZAAR) 100-12.5 MG tablet Take 1 tablet by mouth daily. 08/27/15   [provider]  metoprolol tartrate (LOPRESSOR) 25 MG tablet Take 1 tablet (25 mg total) by mouth 2 (two) times daily. 09/30/13   Hongalgi, Maximino Greenland, MD    Family  History Family History  Problem Relation Age of Onset   Prostate cancer Brother    Colon cancer Neg Hx     Social History Social History   Tobacco Use   Smoking status: Never   Smokeless tobacco: Never  Substance Use Topics   Alcohol use: No   Drug use: No     Allergies   Patient has no known allergies.   Review of Systems Review of Systems  As noted in HPI Physical Exam Triage Vital Signs ED Triage Vitals  Encounter Vitals Group     BP 08/31/23 0848 (!) 148/79     Systolic BP Percentile --      Diastolic BP Percentile --      Pulse Rate 08/31/23 0848 (!) 102     Resp 08/31/23 0848 18     Temp 08/31/23 0848 98.2 F (36.8 C)     Temp Source 08/31/23 0848 Oral     SpO2 08/31/23 0848 98 %     Weight --      Height --      Head Circumference --      Peak Flow --      Pain Score 08/31/23 0845 8     Pain Loc --      Pain Education --      Exclude from Growth Chart --  Orthostatic VS for the past 24 hrs:  BP- Lying Pulse- Lying BP- Sitting Pulse- Sitting BP- Standing at 0 minutes Pulse- Standing at 0 minutes  08/31/23 0921 143/81 73 132/75 82 150/75 89    Updated Vital Signs BP (!) 148/79 (BP Location: Left Arm)   Pulse (!) 102   Temp 98.2 F (36.8 C) (Oral)   Resp 18   SpO2 98%   Visual Acuity Right Eye Distance:   Left Eye Distance:   Bilateral Distance:    Right Eye Near:   Left Eye Near:    Bilateral Near:     Physical Exam Vitals and nursing note reviewed.  Constitutional:      General: He is not in acute distress.    Appearance: He is not toxic-appearing or diaphoretic.  HENT:     Right Ear: External ear normal.     Left Ear: External ear normal.  Eyes:     General: No scleral icterus.    Conjunctiva/sclera: Conjunctivae normal.  Cardiovascular:     Rate and Rhythm: Normal rate and regular rhythm.     Comments: Rate 89 Pulmonary:     Effort: Pulmonary effort is normal.  Abdominal:     General: Abdomen is flat. Bowel sounds are  normal.     Palpations: Abdomen is soft.     Comments: Has mild tenderness on RLQ. Has well healed scar on lower mid line area  Musculoskeletal:     Cervical back: Neck supple.  Skin:    General: Skin is warm and dry.  Neurological:     Mental Status: He is alert and oriented to person, place, and time.     Gait: Gait normal.  Psychiatric:        Mood and Affect: Mood normal.        Behavior: Behavior normal.        Thought Content: Thought content normal.        Judgment: Judgment normal.      UC Treatments / Results  Labs (all labs ordered are listed, but only abnormal results are displayed) Labs Reviewed - No data to display  EKG   Radiology No results found.  Procedures Procedures (including critical care time)  Medications Ordered in UC Medications  ondansetron (ZOFRAN) injection 4 mg (4 mg Intramuscular Given 08/31/23 0911)    Initial Impression / Assessment and Plan / UC Course  I have reviewed the triage vital signs and the nursing notes. He was given Zofran 4 mg IM and was able to hold 4 oz of sprite. His abdomen tenderness resolved.   Gastroenteritis  Placed on Zofran as noted. GE diet instructions reviewed.     Final Clinical Impressions(s) / UC Diagnoses   Final diagnoses:  Gastroenteritis     Discharge Instructions      Drink 2 bottles of electrolyte fluids a day Eat 2 bananas a day today and tomorrow, broth, crackers and advance to solids if you are able to hold this in 24 hours.      ED Prescriptions     Medication Sig Dispense Auth. Provider   ondansetron (ZOFRAN-ODT) 4 MG disintegrating tablet Take 1 tablet (4 mg total) by mouth every 8 (eight) hours as needed for nausea or vomiting. 20 tablet Rodriguez-Southworth, Nettie Elm, PA-C      PDMP not reviewed this encounter.   Garey Ham, PA-C 08/31/23 1019

## 2023-08-31 NOTE — Discharge Instructions (Addendum)
 Drink 2 bottles of electrolyte fluids a day Eat 2 bananas a day today and tomorrow, broth, crackers and advance to solids if you are able to hold this in 24 hours.

## 2023-09-07 DIAGNOSIS — Z8673 Personal history of transient ischemic attack (TIA), and cerebral infarction without residual deficits: Secondary | ICD-10-CM | POA: Diagnosis not present

## 2023-09-07 DIAGNOSIS — I10 Essential (primary) hypertension: Secondary | ICD-10-CM | POA: Diagnosis not present

## 2023-09-07 DIAGNOSIS — E7800 Pure hypercholesterolemia, unspecified: Secondary | ICD-10-CM | POA: Diagnosis not present

## 2023-10-18 ENCOUNTER — Encounter: Payer: Self-pay | Admitting: Internal Medicine

## 2023-10-18 ENCOUNTER — Encounter: Payer: Self-pay | Admitting: Neurology

## 2023-10-18 ENCOUNTER — Ambulatory Visit: Admitting: Neurology

## 2023-10-18 VITALS — BP 190/107 | HR 80 | Ht 65.0 in | Wt 131.5 lb

## 2023-10-18 DIAGNOSIS — Z8673 Personal history of transient ischemic attack (TIA), and cerebral infarction without residual deficits: Secondary | ICD-10-CM | POA: Diagnosis not present

## 2023-10-18 DIAGNOSIS — Z87898 Personal history of other specified conditions: Secondary | ICD-10-CM | POA: Diagnosis not present

## 2023-10-18 DIAGNOSIS — R413 Other amnesia: Secondary | ICD-10-CM | POA: Diagnosis not present

## 2023-10-18 DIAGNOSIS — E876 Hypokalemia: Secondary | ICD-10-CM | POA: Insufficient documentation

## 2023-10-18 NOTE — Patient Instructions (Addendum)
 Continue current medications  Ask daughter to review all medications with patient at home and to get a pill box. If he was taking levetiracetam , he can continue it and have Dr. Schuyler Custard reduce it in the future  If he was not taking Levetiracetam , do not start it.  We discussed ways to reduce the risk of developing dementia such as exercise, maintaining a good sleep, good diet and good health. They voiced understanding    There are well-accepted and sensible ways to reduce risk for Alzheimers disease and other degenerative brain disorders .  Exercise Daily Walk A daily 20 minute walk should be part of your routine. Disease related apathy can be a significant roadblock to exercise and the only way to overcome this is to make it a daily routine and perhaps have a reward at the end (something your loved one loves to eat or drink perhaps) or a personal trainer coming to the home can also be very useful. Most importantly, the patient is much more likely to exercise if the caregiver / spouse does it with him/her. In general a structured, repetitive schedule is best.  General Health: Any diseases which effect your body will effect your brain such as a pneumonia, urinary infection, blood clot, heart attack or stroke. Keep contact with your primary care doctor for regular follow ups.  Sleep. A good nights sleep is healthy for the brain. Seven hours is recommended. If you have insomnia or poor sleep habits we can give you some instructions. If you have sleep apnea wear your mask.  Diet: Eating a heart healthy diet is also a good idea; fish and poultry instead of red meat, nuts (mostly non-peanuts), vegetables, fruits, olive oil or canola oil (instead of butter), minimal salt (use other spices to flavor foods), whole grain rice, bread, cereal and pasta and wine in moderation.Research is now showing that the MIND diet, which is a combination of The Mediterranean diet and the DASH diet, is beneficial for cognitive  processing and longevity. Information about this diet can be found in The MIND Diet, a book by Andria Keeler, MS, RDN, and online at [employer]  Finances, Power of [address] and Advance Directives: You should consider putting legal safeguards in place with regard to financial and medical decision making. While the spouse always has power of attorney for medical and financial issues in the absence of any form, you should consider what you want in case the spouse / caregiver is no longer around or capable of making decisions.

## 2023-10-18 NOTE — Progress Notes (Signed)
 [facility]  PATIENT: Ronald [name] DOB: [birth year]  REQUESTING CLINICIAN: Imelda Man, MD HISTORY FROM: Patient/Daughter  REASON FOR VISIT: Memory loss    HISTORICAL  CHIEF COMPLAINT:  Chief Complaint  Patient presents with   RM 12 /Memory    Pt is here with his Daughter. Pt states that he feels like his memory is okay. Pt's daughter states that pt's short term is the issue. Pt has history of a Stroke.     HISTORY OF PRESENT ILLNESS:  This 78 year old gentleman past medical history of previous stroke and seizures, last one 2015, hypertension, who is presenting with her daughter with complaint of memory loss.  Patient feels like his memory has been getting worse after recent hospitalization in April due to abdominal pain nausea vomiting but daughter tells me that his memory has been getting worse for the past 1 to 2 years.  Memory problem described as forgetfulness, mainly his short-term memory.  Sometimes he is repetitive.  Daughter worry that patient sometimes forgets to eat.  He does live with a roommate, he is independent of all activities of daily living.  He does use a cane with ambulation but had a couple falls, last 1 over a year ago.  Again patient feels like his memory is not what he used to be but he still functioning appropriately. He continues to drive, denies being lost in familiar place.  From his recent PCP visit, he reports not taking levetiracetam  for his seizures but on today's visit, he was not sure if was taking the medication or not.  There was a question asked by PCP should he restart the medication in the case that he was not taking it.    TBI:  No past history of TBI Stroke: Yes with left leg weakness  Seizures: Only one episode  Sleep:  no history of sleep apnea.   Mood: patient denies anxiety and depression Family history of Dementia: Denies  Functional status: independent in all ADLs and IADLs Patient lives with a roommate  . Cooking: some Cleaning: yes, no issues  Shopping: yes no issues  Bathing: yes Toileting: yes Driving: Still drive Bills: Daughter  Medications: Self Ever left the stove on by accident?: denies Forget how to use items around the house?: denies  Getting lost going to familiar places?: Denies  Forgetting loved ones names?: Denies  Word finding difficulty? Some  Sleep: good    OTHER MEDICAL CONDITIONS: Hypertension, history of stroke with left leg weakness, seizure    REVIEW OF SYSTEMS: Full 14 system review of systems performed and negative with exception of: As noted in the HPI   ALLERGIES: No Known Allergies  HOME MEDICATIONS: Outpatient Medications Prior to Visit  Medication Sig Dispense Refill   aspirin  EC 81 MG tablet Take 1 tablet (81 mg total) by mouth daily.     levETIRAcetam  (KEPPRA ) 500 MG tablet Take 1 tablet (500 mg total) by mouth 2 (two) times daily. 60 tablet 0   losartan-hydrochlorothiazide  (HYZAAR) 100-12.5 MG tablet Take 1 tablet by mouth daily.  4   metoprolol  tartrate (LOPRESSOR ) 25 MG tablet Take 1 tablet (25 mg total) by mouth 2 (two) times daily. 60 tablet 0   olmesartan (BENICAR) 20 MG tablet Take 20 mg by mouth daily.     ondansetron  (ZOFRAN -ODT) 4 MG disintegrating tablet Take 1 tablet (4 mg total) by mouth every 8 (eight) hours as needed for nausea or vomiting. 20 tablet 0   sildenafil (VIAGRA) 50 MG tablet  Take 50 mg by mouth daily as needed. (Patient not taking: Reported on 10/18/2023)     No facility-administered medications prior to visit.    PAST MEDICAL HISTORY: Past Medical History:  Diagnosis Date   Complex partial seizure (HCC)    Hypertension    Stroke (HCC)     PAST SURGICAL HISTORY: Past Surgical History:  Procedure Laterality Date   APPENDECTOMY     COLONOSCOPY  09/16/2015    FAMILY HISTORY: Family History  Problem Relation Age of Onset   Prostate cancer Brother    Colon cancer Neg Hx     SOCIAL HISTORY: Social  History   Socioeconomic History   Marital status: Divorced    Spouse name: Not on file   Number of children: 2   Years of education: hs   Highest education level: Not on file  Occupational History   Occupation: Retired  Tobacco Use   Smoking status: Never   Smokeless tobacco: Never  Substance and Sexual Activity   Alcohol use: No   Drug use: No   Sexual activity: Not on file  Other Topics Concern   Not on file  Social History Narrative   Not on file   Social Drivers of Health   Financial Resource Strain: Not on file  Food Insecurity: Not on file  Transportation Needs: Not on file  Physical Activity: Not on file  Stress: Not on file  Social Connections: Not on file  Intimate Partner Violence: Not on file    PHYSICAL EXAM   GENERAL EXAM/CONSTITUTIONAL: Vitals:  Vitals:   10/18/23 1317  BP: (!) 190/107  Pulse: 80  Weight: 131 lb 8 oz (59.6 kg)  Height: 5\' 5"  (1.651 m)   Body mass index is 21.88 kg/m. Wt Readings from Last 3 Encounters:  10/18/23 131 lb 8 oz (59.6 kg)  09/16/15 150 lb (68 kg)  08/27/15 150 lb (68 kg)   Patient is in no distress; well developed, nourished and groomed; neck is supple  MUSCULOSKELETAL: Gait, strength, tone, movements noted in Neurologic exam below  NEUROLOGIC: MENTAL STATUS:     10/18/2023    1:20 PM  MMSE - Mini Mental State Exam  Orientation to time 5  Orientation to Place 5  Registration 3  Attention/ Calculation 1  Recall 3  Language- name 2 objects 2  Language- repeat 0  Language- follow 3 step command 3  Language- read & follow direction 1  Write a sentence 1  Copy design 1  Total score 25   awake, alert, oriented to person, place and time recent and remote memory intact normal attention and concentration language fluent, comprehension intact, naming intact fund of knowledge appropriate  CRANIAL NERVE:  2nd, 3rd, 4th, 6th- visual fields full to confrontation, extraocular muscles intact, no  nystagmus 5th - facial sensation symmetric 7th - facial strength symmetric 8th - hearing intact 9th - palate elevates symmetrically, uvula midline 11th - shoulder shrug symmetric 12th - tongue protrusion midline  MOTOR:  normal bulk and tone, full strength in the BUE, BLE  SENSORY:  normal and symmetric to light touch  COORDINATION:  finger-nose-finger, fine finger movements normal  GAIT/STATION:  With a cane    DIAGNOSTIC DATA (LABS, IMAGING, TESTING) - I reviewed patient records, labs, notes, testing and imaging myself where available.  Lab Results  Component Value Date   WBC 7.2 11/08/2013   HGB 14.7 11/08/2013   HCT 43.3 11/08/2013   MCV 83.8 11/08/2013   PLT 236 11/08/2013  Component Value Date/Time   NA 140 11/08/2013 1535   K 4.0 11/08/2013 1535   CL 101 11/08/2013 1535   CO2 28 11/08/2013 1535   GLUCOSE 112 (H) 11/08/2013 1535   BUN 12 11/08/2013 1535   CREATININE 0.82 11/08/2013 1535   CALCIUM 9.6 11/08/2013 1535   PROT 7.1 09/28/2013 2036   ALBUMIN 3.4 (L) 09/28/2013 2036   AST 20 09/28/2013 2036   ALT 13 09/28/2013 2036   ALKPHOS 88 09/28/2013 2036   BILITOT 0.7 09/28/2013 2036   GFRNONAA 89 (L) 11/08/2013 1535   GFRAA >90 11/08/2013 1535   Lab Results  Component Value Date   CHOL 166 09/30/2013   HDL 51 09/30/2013   LDLCALC 99 09/30/2013   TRIG 80 09/30/2013   CHOLHDL 3.3 09/30/2013   Lab Results  Component Value Date   HGBA1C 6.4 (H) 09/29/2013   No results found for: "VITAMINB12" No results found for: "TSH"   MRI Brain 2015 Chronic microvascular ischemic change. No acute intracranial findings.   Routine EEG 2015: Normal    ASSESSMENT AND PLAN  78 y.o. year old male with history of stroke and seizures, hypertension, who is presenting with complaints of memory loss described as difficulty with short-term memory.  On exam, he scored 25 out of 30 on MMSE but patient is fully independent all ADLs.  I have informed patient and  daughter that he is on the cusp of normal cognition versus mild cognitive impairment but he does not meet criteria for dementia.  We discussed ways of reducing the risks of developing dementia including keeping a good health, good diet, good sleep and an exercise regimen plan.  In terms of his levetiracetam  and his history of seizures, patient is unsure if he is taking the medication or not.  I have advised daughter to review all medications at home and if he is taking the medication, he can continue with and have Dr. Schuyler Custard reduce it in the future. In the case that he is not taking the medication, he should not restart it.  Continue to follow with PCP and return as needed.   1. Memory loss   2. History of stroke   3. History of seizure      Patient Instructions  Continue current medications  Ask daughter to review all medications with patient at home and to get a pill box. If he was taking levetiracetam , he can continue it and have Dr. Schuyler Custard reduce it in the future  If he was not taking Levetiracetam , do not start it.  We discussed ways to reduce the risk of developing dementia such as exercise, maintaining a good sleep, good diet and good health. They voiced understanding    There are well-accepted and sensible ways to reduce risk for Alzheimers disease and other degenerative brain disorders .  Exercise Daily Walk A daily 20 minute walk should be part of your routine. Disease related apathy can be a significant roadblock to exercise and the only way to overcome this is to make it a daily routine and perhaps have a reward at the end (something your loved one loves to eat or drink perhaps) or a personal trainer coming to the home can also be very useful. Most importantly, the patient is much more likely to exercise if the caregiver / spouse does it with him/her. In general a structured, repetitive schedule is best.  General Health: Any diseases which effect your body will effect your brain such  as a pneumonia, urinary  infection, blood clot, heart attack or stroke. Keep contact with your primary care doctor for regular follow ups.  Sleep. A good nights sleep is healthy for the brain. Seven hours is recommended. If you have insomnia or poor sleep habits we can give you some instructions. If you have sleep apnea wear your mask.  Diet: Eating a heart healthy diet is also a good idea; fish and poultry instead of red meat, nuts (mostly non-peanuts), vegetables, fruits, olive oil or canola oil (instead of butter), minimal salt (use other spices to flavor foods), whole grain rice, bread, cereal and pasta and wine in moderation.Research is now showing that the MIND diet, which is a combination of The Mediterranean diet and the DASH diet, is beneficial for cognitive processing and longevity. Information about this diet can be found in The MIND Diet, a book by Andria Keeler, MS, RDN, and online at [employer]  Finances, Power of [address] and Advance Directives: You should consider putting legal safeguards in place with regard to financial and medical decision making. While the spouse always has power of attorney for medical and financial issues in the absence of any form, you should consider what you want in case the spouse / caregiver is no longer around or capable of making decisions.   No orders of the defined types were placed in this encounter.   No orders of the defined types were placed in this encounter.   Return if symptoms worsen or fail to improve.  I have spent a total of 65 minutes dedicated to this patient today, preparing to see patient, performing a medically appropriate examination and evaluation, ordering tests and/or medications and procedures, and counseling and educating the patient/family/caregiver; independently interpreting result and communicating results to the family/patient/caregiver; and documenting clinical information in the electronic  medical record.   Cassandra Cleveland, MD 10/18/2023, 5:02 PM  [facility] [address] [place], [place] [address] [phone]
